# Patient Record
Sex: Female | Born: 1965 | Race: White | Hispanic: No | Marital: Married | Smoking: Never smoker
Health system: Southern US, Community
[De-identification: ages and names within clinical notes are randomized; demographics above are authoritative.]

## PROBLEM LIST (undated history)

## (undated) DIAGNOSIS — I10 Essential (primary) hypertension: Secondary | ICD-10-CM

## (undated) DIAGNOSIS — I2699 Other pulmonary embolism without acute cor pulmonale: Secondary | ICD-10-CM

## (undated) HISTORY — PX: WISDOM TOOTH EXTRACTION: SHX21

## (undated) HISTORY — PX: BREAST CYST ASPIRATION: SHX578

---

## 2011-08-10 ENCOUNTER — Other Ambulatory Visit: Payer: Self-pay | Admitting: Obstetrics and Gynecology

## 2011-08-10 DIAGNOSIS — N6009 Solitary cyst of unspecified breast: Secondary | ICD-10-CM

## 2011-08-18 ENCOUNTER — Ambulatory Visit
Admission: RE | Admit: 2011-08-18 | Discharge: 2011-08-18 | Disposition: A | Discharge status: Home / Self Care | Payer: 59 | Source: Ambulatory Visit | Attending: Obstetrics and Gynecology | Admitting: Obstetrics and Gynecology

## 2011-08-18 DIAGNOSIS — N6009 Solitary cyst of unspecified breast: Secondary | ICD-10-CM

## 2012-07-10 ENCOUNTER — Other Ambulatory Visit: Payer: Self-pay | Admitting: Obstetrics and Gynecology

## 2012-07-10 DIAGNOSIS — Z1231 Encounter for screening mammogram for malignant neoplasm of breast: Secondary | ICD-10-CM

## 2012-08-20 ENCOUNTER — Ambulatory Visit
Admission: RE | Admit: 2012-08-20 | Discharge: 2012-08-20 | Disposition: A | Discharge status: Home / Self Care | Payer: 59 | Source: Ambulatory Visit | Attending: Obstetrics and Gynecology | Admitting: Obstetrics and Gynecology

## 2012-08-20 DIAGNOSIS — Z1231 Encounter for screening mammogram for malignant neoplasm of breast: Secondary | ICD-10-CM

## 2013-08-04 DIAGNOSIS — I2699 Other pulmonary embolism without acute cor pulmonale: Secondary | ICD-10-CM

## 2013-08-04 HISTORY — DX: Other pulmonary embolism without acute cor pulmonale: I26.99

## 2013-08-19 ENCOUNTER — Inpatient Hospital Stay (HOSPITAL_BASED_OUTPATIENT_CLINIC_OR_DEPARTMENT_OTHER)
Admission: EM | Admit: 2013-08-19 | Discharge: 2013-08-22 | DRG: 299 | Disposition: A | Discharge status: Home / Self Care | Payer: 59 | Attending: Internal Medicine | Admitting: Internal Medicine

## 2013-08-19 ENCOUNTER — Encounter (HOSPITAL_BASED_OUTPATIENT_CLINIC_OR_DEPARTMENT_OTHER): Payer: Self-pay | Admitting: Emergency Medicine

## 2013-08-19 ENCOUNTER — Other Ambulatory Visit: Payer: Self-pay

## 2013-08-19 ENCOUNTER — Emergency Department (HOSPITAL_BASED_OUTPATIENT_CLINIC_OR_DEPARTMENT_OTHER): Payer: 59

## 2013-08-19 ENCOUNTER — Other Ambulatory Visit (HOSPITAL_BASED_OUTPATIENT_CLINIC_OR_DEPARTMENT_OTHER): Payer: Self-pay | Admitting: Family Medicine

## 2013-08-19 ENCOUNTER — Ambulatory Visit (HOSPITAL_BASED_OUTPATIENT_CLINIC_OR_DEPARTMENT_OTHER)
Admission: RE | Admit: 2013-08-19 | Discharge: 2013-08-19 | Disposition: A | Discharge status: Home / Self Care | Payer: 59 | Source: Ambulatory Visit | Attending: Family Medicine | Admitting: Family Medicine

## 2013-08-19 DIAGNOSIS — M7989 Other specified soft tissue disorders: Secondary | ICD-10-CM

## 2013-08-19 DIAGNOSIS — I82409 Acute embolism and thrombosis of unspecified deep veins of unspecified lower extremity: Secondary | ICD-10-CM

## 2013-08-19 DIAGNOSIS — I2699 Other pulmonary embolism without acute cor pulmonale: Secondary | ICD-10-CM | POA: Diagnosis present

## 2013-08-19 DIAGNOSIS — I2692 Saddle embolus of pulmonary artery without acute cor pulmonale: Secondary | ICD-10-CM | POA: Diagnosis present

## 2013-08-19 DIAGNOSIS — I10 Essential (primary) hypertension: Secondary | ICD-10-CM | POA: Diagnosis present

## 2013-08-19 DIAGNOSIS — M79609 Pain in unspecified limb: Secondary | ICD-10-CM

## 2013-08-19 DIAGNOSIS — I803 Phlebitis and thrombophlebitis of lower extremities, unspecified: Secondary | ICD-10-CM | POA: Diagnosis present

## 2013-08-19 DIAGNOSIS — Z79899 Other long term (current) drug therapy: Secondary | ICD-10-CM

## 2013-08-19 HISTORY — DX: Other pulmonary embolism without acute cor pulmonale: I26.99

## 2013-08-19 HISTORY — DX: Essential (primary) hypertension: I10

## 2013-08-19 LAB — COMPREHENSIVE METABOLIC PANEL
ALK PHOS: 58 U/L (ref 39–117)
ALT: 13 U/L (ref 0–35)
AST: 14 U/L (ref 0–37)
Albumin: 3 g/dL — ABNORMAL LOW (ref 3.5–5.2)
BUN: 7 mg/dL (ref 6–23)
CALCIUM: 8.6 mg/dL (ref 8.4–10.5)
CO2: 21 mEq/L (ref 19–32)
CREATININE: 0.72 mg/dL (ref 0.50–1.10)
Chloride: 103 mEq/L (ref 96–112)
GFR calc non Af Amer: >90 mL/min (ref >90)
GLUCOSE: 89 mg/dL (ref 70–99)
Potassium: 3.7 mEq/L (ref 3.7–5.3)
Sodium: 140 mEq/L (ref 137–147)
TOTAL PROTEIN: 7.1 g/dL (ref 6.0–8.3)
Total Bilirubin: 0.3 mg/dL (ref 0.3–1.2)

## 2013-08-19 LAB — CBC WITH DIFFERENTIAL/PLATELET
BASOS ABS: 0 10*3/uL (ref 0.0–0.1)
Basophils Absolute: 0 10*3/uL (ref 0.0–0.1)
Basophils Relative: 0 % (ref 0–1)
Basophils Relative: 0 % (ref 0–1)
Eosinophils Absolute: 0.1 10*3/uL (ref 0.0–0.7)
Eosinophils Absolute: 0.1 10*3/uL (ref 0.0–0.7)
Eosinophils Relative: 1 % (ref 0–5)
Eosinophils Relative: 1 % (ref 0–5)
HCT: 39.1 % (ref 36.0–46.0)
HEMATOCRIT: 40.6 % (ref 36.0–46.0)
HEMOGLOBIN: 13.8 g/dL (ref 12.0–15.0)
Hemoglobin: 13.7 g/dL (ref 12.0–15.0)
LYMPHS PCT: 23 % (ref 12–46)
Lymphocytes Relative: 17 % (ref 12–46)
Lymphs Abs: 1.5 10*3/uL (ref 0.7–4.0)
Lymphs Abs: 1.9 10*3/uL (ref 0.7–4.0)
MCH: 32.4 pg (ref 26.0–34.0)
MCH: 33 pg (ref 26.0–34.0)
MCHC: 33.7 g/dL (ref 30.0–36.0)
MCHC: 35.3 g/dL (ref 30.0–36.0)
MCV: 96 fL (ref 78.0–100.0)
MCV: 93.5 fL (ref 78.0–100.0)
MONOS PCT: 5 % (ref 3–12)
Monocytes Absolute: 0.5 10*3/uL (ref 0.1–1.0)
Monocytes Absolute: 0.4 10*3/uL (ref 0.1–1.0)
Monocytes Relative: 6 % (ref 3–12)
NEUTROS ABS: 6.8 10*3/uL (ref 1.7–7.7)
NEUTROS ABS: 5.9 10*3/uL (ref 1.7–7.7)
NEUTROS PCT: 71 % (ref 43–77)
Neutrophils Relative %: 76 % (ref 43–77)
PLATELETS: 188 10*3/uL (ref 150–400)
Platelets: 206 10*3/uL (ref 150–400)
RBC: 4.23 MIL/uL (ref 3.87–5.11)
RBC: 4.18 MIL/uL (ref 3.87–5.11)
RDW: 12.1 % (ref 11.5–15.5)
RDW: 12.6 % (ref 11.5–15.5)
WBC: 8.9 10*3/uL (ref 4.0–10.5)
WBC: 8.2 10*3/uL (ref 4.0–10.5)

## 2013-08-19 LAB — BASIC METABOLIC PANEL
BUN: 9 mg/dL (ref 6–23)
CALCIUM: 8.6 mg/dL (ref 8.4–10.5)
CHLORIDE: 102 meq/L (ref 96–112)
CO2: 20 mEq/L (ref 19–32)
CREATININE: 0.8 mg/dL (ref 0.50–1.10)
GFR calc Af Amer: >90 mL/min (ref >90)
GFR, EST NON AFRICAN AMERICAN: 88 mL/min — AB (ref >90)
Glucose, Bld: 91 mg/dL (ref 70–99)
Potassium: 3.6 mEq/L — ABNORMAL LOW (ref 3.7–5.3)
Sodium: 137 mEq/L (ref 137–147)

## 2013-08-19 LAB — MAGNESIUM: Magnesium: 2.1 mg/dL (ref 1.5–2.5)

## 2013-08-19 LAB — TROPONIN I: Troponin I: <0.3 ng/mL (ref <0.30)

## 2013-08-19 MED ORDER — ACETAMINOPHEN 650 MG RE SUPP: 650.0000 mg | Freq: Four times a day (QID) | RECTAL | Status: DC | PRN

## 2013-08-19 MED ORDER — SODIUM CHLORIDE 0.9 % IV SOLN
INTRAVENOUS | Status: DC
  Administered 2013-08-19 – 2013-08-20 (×2): via INTRAVENOUS

## 2013-08-19 MED ORDER — HEPARIN (PORCINE) IN NACL 100-0.45 UNIT/ML-% IJ SOLN
1550.0000 [IU]/h | INTRAMUSCULAR | Status: AC
  Administered 2013-08-19: 1150 [IU]/h via INTRAVENOUS
  Administered 2013-08-20: 1300 [IU]/h via INTRAVENOUS
  Filled 2013-08-19 (×4): qty 250

## 2013-08-19 MED ORDER — HEPARIN BOLUS VIA INFUSION
5000.0000 [IU] | Freq: Once | INTRAVENOUS | Status: AC
  Administered 2013-08-19: 5000 [IU] via INTRAVENOUS

## 2013-08-19 MED ORDER — ACETAMINOPHEN 325 MG PO TABS
650.0000 mg | ORAL_TABLET | Freq: Four times a day (QID) | ORAL | Status: DC | PRN
  Administered 2013-08-19: 650 mg via ORAL
  Filled 2013-08-19: qty 2

## 2013-08-19 MED ORDER — DOCUSATE SODIUM 100 MG PO CAPS
100.0000 mg | ORAL_CAPSULE | Freq: Two times a day (BID) | ORAL | Status: DC
  Administered 2013-08-19: 100 mg via ORAL
  Filled 2013-08-19 (×7): qty 1

## 2013-08-19 MED ORDER — IOHEXOL 350 MG/ML SOLN
100.0000 mL | Freq: Once | INTRAVENOUS | Status: AC | PRN
  Administered 2013-08-19: 100 mL via INTRAVENOUS

## 2013-08-19 MED ORDER — OXYCODONE HCL 5 MG PO TABS: 5.0000 mg | ORAL_TABLET | ORAL | Status: DC | PRN

## 2013-08-19 NOTE — Progress Notes (Signed)
Patient arrived with Heparin infusing at 11.5 mL/hr.  VSS BP 159/83  Pulse 92  Temp(Src) 97.9 F (36.6 C) (Oral)  Resp 16  Ht 5\' 2"  (1.575 m)  Wt 82.8 kg (182 lb 8.7 oz)  BMI 33.38 kg/m2  SpO2 100%  LMP 07/25/2013.  Awaiting orders. Will Continue to monitor.

## 2013-08-19 NOTE — ED Notes (Signed)
States she has a blood clot in her right leg. Pain and SOB for almost 2 weeks. [facility] to be seen.

## 2013-08-19 NOTE — ED Provider Notes (Addendum)
I saw and evaluated the patient, reviewed the resident's note and I agree with the findings and plan.   EKG Interpretation None     Patient with right lower extremity swelling and was diagnosed with having DVT today. She's also been short of breath. CT scan of chest shows multiple pulmonary emboli bilaterally. Patient to be started on heparin and [facility]      Rate: 83   Rhythm: normal sinus rhythm  QRS Axis: normal  Intervals: normal  ST/T Wave abnormalities: normal  Conduction Disutrbances:none  Narrative Interpretation:   Old EKG Reviewed: none available  Toy BakerAnthony T Deliah Strehlow, MD 08/19/13 1456  Toy BakerAnthony T Pryce Folts, MD 08/19/13 [phone]

## 2013-08-19 NOTE — ED Notes (Signed)
[facility] notified of bed [phone]

## 2013-08-19 NOTE — Progress Notes (Addendum)
ANTICOAGULATION CONSULT NOTE - Initial Consult  Pharmacy Consult for heparin gtt Indication: pulmonary embolus  No Known Allergies  Patient Measurements: Height: 5\' 2"  (157.5 cm) Weight: 185 lb (83.915 kg) IBW/kg (Calculated) : 50.1 Heparin Dosing Weight: 69 kg  Vital Signs: Temp: 97.9 F (36.6 C) (03/16 1307) Temp src: Oral (03/16 1307) BP: 133/83 mmHg (03/16 1307) Pulse Rate: 92 (03/16 1307)  Labs: No results found for this basename: HGB, HCT, PLT, APTT, LABPROT, INR, HEPARINUNFRC, CREATININE, CKTOTAL, CKMB, TROPONINI,  in the last 72 hours  CrCl is unknown because no creatinine reading has been taken.   Medical History: History reviewed. No pertinent past medical history.  Assessment: 48 yo F with hx of superficial DVT 2 years ago [facility] after PCP office visit where she complained of R leg pain and SOB since prior week.  CTA of chest with contrast reveals a large bilateral saddle emboli with high clot burden.  Pharmacy is consulted to start heparin gtt.  Per d/w Dr. Claiborne BillingsKuneff, patient is not on any "blood thinners" and will be [facility] shortly.  Goal of Therapy:  Heparin level 0.3-0.7 units/ml Monitor platelets by anticoagulation protocol: Yes   Plan:  - give heparin IV bolus of 5000 units, followed by 1150 units/hour - draw 6h HL - daily HL and CBC - monitor for s/s of bleeding  - f/u initial BMP and CBC when reported  Harrold DonathNathan E. Achilles Dunkope, PharmD Clinical Pharmacist - Resident Pager: [phone] Pharmacy: [phone] 08/19/2013 3:08 PM    Addendum ======================= Patient H/H and plt are wnl.  SCr resulted at 0.8 with estimated CrCl ~89.  Plan: - continue with current plan  Shelba Flakeathan E. Achilles Dunkope, PharmD Clinical Pharmacist - Resident Pager: [phone] Pharmacy: [phone] 08/19/2013 3:57 PM

## 2013-08-19 NOTE — ED Notes (Addendum)
Pt presents with redness to R calf and SOB. Pt reports wearing heels one week ago and felt like her muscles were stretched and sore. Pt sts " I had a blood clot in the same leg about two years ago. I just took aspirin then and that helped." Pt reports elevating leg and taking ibuprofen with pain with some relief.

## 2013-08-19 NOTE — H&P (Signed)
[facility] History and Physical  Joanne Powell RUE:[id number] DOB: 1965/10/22 DOA: 08/19/2013  Referring physician:  PCP: Ethel Rana  Specialists:   Chief Complaint: Right leg pain and shortness of breath x1 week  HPI: Joanne Powell is a 48 y.o. female PMHx superficial thrombophlebitis 2013 presented to the [facility] ED in [facility] after presenting to her PCP with right leg pain and shortness of breath for a week. Patient reports she were high he'll puts last week and then noticed right ankle pain that she thought was tendon pain. She then noticed that the pain started hurting from her ankle up to the back of her knee. She has been treating the pain with ibuprofen. States she thought she was feeling better when Saturday she squatted and felt that she pulled a muscle. She has been using a total properly throughout the weekend and has been increasingly more pain to today. She complains of mild shortness of breath throughout this entire event she describes as winded with palpitations. She reports she had a superficial DVT in the same leg 2 years ago that was treated with baby aspirin. She denies any travel, long car/pus/plain rides. She is a nonsmoker. She is on birth control pills. She is also on birth control pills the first time this happened. She states she does sit at work the majority today. She takes no other medications. Family history significant for breast cancer in mother and colon cancer in maternal grandmother.Currently patient sitting comfortably in bed, positive DOE, negative chest pain.    Review of Systems: The patient denies anorexia, fever, weight loss,, vision loss, decreased hearing, hoarseness, syncope, peripheral edema, balance deficits, hemoptysis, abdominal pain, melena, hematochezia, severe indigestion/heartburn, hematuria, incontinence, genital sores, muscle weakness, suspicious skin lesions, transient blindness, difficulty walking, depression, unusual weight  change, abnormal bleeding, enlarged lymph nodes, angioedema, and breast masses.    TRAVEL HISTORY:   Procedure CT angiogram chest PE protocol 08/19/2013 1. Very large bilateral saddle pulmonary emboli. Clot burden is very high.  Venous ultrasound of right lower extremity 08/19/2013  Thrombus within deep veins: Thrombus is noted within the superficial  femoral vein, popliteal vein, gastrocnemius veins and infrapopliteal veins.      Antibiotics   Consultation Dr. Arbie Cookey (vascular surgery)    Past Medical History  Diagnosis Date  . HTN (hypertension) 08/19/2013   History reviewed. No pertinent past surgical history. Social History:  reports that she has never smoked. She does not have any smokeless tobacco history on file. She reports that she does not drink alcohol or use illicit drugs. where does patient live--home, ALF, SNF? and with whom if at home? Can patient participate in ADLs? yes  No Known Allergies  No family history on file. Family history significant for breast cancer in mother and colon cancer in maternal grandmother.  Prior to Admission medications   Medication Sig Start Date End Date Taking? Authorizing Provider  Desogestrel-Ethinyl Estradiol (RECLIPSEN PO) Take 1 tablet by mouth daily.   Yes Historical Provider, MD  ibuprofen (ADVIL,MOTRIN) 200 MG tablet Take 200-400 mg by mouth every 6 (six) hours as needed for fever or mild pain.   Yes Historical Provider, MD  Multiple Vitamin (MULTIVITAMIN) tablet Take 1 tablet by mouth daily.   Yes Historical Provider, MD  naproxen sodium (ANAPROX) 220 MG tablet Take 220 mg by mouth daily as needed (pain).   Yes Historical Provider, MD   Physical Exam: Filed Vitals:   08/19/13 1307 08/19/13 1545 08/19/13 1755  BP: 133/83 141/88  159/83  Pulse: 92 92 92  Temp: 97.9 F (36.6 C)  97.9 F (36.6 C)  TempSrc: Oral  Oral  Resp: 20 15 16   Height: 5\' 2"  (1.575 m)  5\' 2"  (1.575 m)  Weight: 83.915 kg (185 lb)  82.8 kg (182 lb  8.7 oz)  SpO2: 100% 100% 100%     General:  A./O. X4, NAD  Eyes: pupils equal reactive to light and  Neck: negative lymphadenopathy negative JVD  Cardiovascular: regular rhythm and rate, negative murmurs rubs or gallops, DP/PT pulse on the right +1 on the left +2  Respiratory: clear to auscultation bilateral  Abdomen: soft, nontender, nondistended, plus bowel sound  Skin: negative bruises/lesions  Musculoskeletal: positive pain to palpation on the right leg; lateral upper thigh, posterior knee, gastrostomy is muscle, ankle.  Neurologic: cranial nerve II through XII intact, able to move all extremities,  Labs on Admission:  Basic Metabolic Panel:  Recent Labs Lab 08/19/13 1516  NA 137  K 3.6*  CL 102  CO2 20  GLUCOSE 91  BUN 9  CREATININE 0.80  CALCIUM 8.6   Liver Function Tests: No results found for this basename: AST, ALT, ALKPHOS, BILITOT, PROT, ALBUMIN,  in the last 168 hours No results found for this basename: LIPASE, AMYLASE,  in the last 168 hours No results found for this basename: AMMONIA,  in the last 168 hours CBC:  Recent Labs Lab 08/19/13 1516  WBC 8.9  NEUTROABS 6.8  HGB 13.7  HCT 40.6  MCV 96.0  PLT 188   Cardiac Enzymes: No results found for this basename: CKTOTAL, CKMB, CKMBINDEX, TROPONINI,  in the last 168 hours  BNP (last 3 results) No results found for this basename: PROBNP,  in the last 8760 hours CBG: No results found for this basename: GLUCAP,  in the last 168 hours  Radiological Exams on Admission: Ct Angio Chest W/cm &/or Wo Cm  08/19/2013   CLINICAL DATA:  Shortness of breath. Right lower extremity deep vein thrombosis.  EXAM: CT ANGIOGRAPHY CHEST WITH CONTRAST  TECHNIQUE: Multidetector CT imaging of the chest was performed using the standard protocol during bolus administration of intravenous contrast. Multiplanar CT image reconstructions and MIPs were obtained to evaluate the vascular anatomy.  CONTRAST:  100mL OMNIPAQUE  IOHEXOL 350 MG/ML SOLN  COMPARISON:  US VENOUS IMG LOWER UNI *R* dated 08/19/2013  FINDINGS: Large bilateral saddle emboli, especially on the left. These appear to span in the main pulmonary artery division and also the bifurcations of the right and left pulmonary arteries. Clot burden is very high.  Right ventricular to left ventricular ratio is 0.85, currently within normal limits, without reversal of the normal interventricular septal contour.  There is evidence of old granulomatous disease with a calcified lower paratracheal lymph node. Biapical pleural parenchymal scarring with faint nodularity. No pulmonary infarct identified. No significant parenchymal hemorrhage. Calcified granuloma in the superior segment left lower lobe.  Review of the MIP images confirms the above findings.  IMPRESSION: 1. Very large bilateral saddle pulmonary emboli. Clot burden is very high. Critical Value/emergent results were called by telephone at the time of interpretation on 08/19/2013 at 2:50 PM to Dr. Bruce Donathony Allen, who verbally acknowledged these results.   Electronically Signed   By: Herbie BaltimoreWalt  Liebkemann M.D.   On: 08/19/2013 14:51   [place] Venous Img Lower Unilateral Right  08/19/2013   CLINICAL DATA:  Right leg pain and swelling  EXAM: Right LOWER EXTREMITY VENOUS DOPPLER ULTRASOUND  TECHNIQUE: Gray-scale sonography with  graded compression, as well as color Doppler and duplex ultrasound, were performed to evaluate the deep venous system from the level of the common femoral vein through the popliteal and proximal calf veins. Spectral Doppler was utilized to evaluate flow at rest and with distal augmentation maneuvers.  COMPARISON:  None.  FINDINGS: Thrombus within deep veins: Thrombus is noted within the superficial femoral vein, popliteal vein, gastrocnemius veins and infrapopliteal veins.  Compressibility of deep veins: Decreased compressibility is noted in the above-noted veins. Decreased flow is noted as well.  Venous reflux:  None  visualized.  Other findings:  None visualized.  IMPRESSION: Diffuse deep venous thrombosis in the right leg from the mid thigh inferiorly.   Electronically Signed   By: Alcide Clever M.D.   On: 08/19/2013 11:46    EKG: NSR, nonspecific ST-T wave changes in leads 3 and aVF  Assessment/Plan Principal Problem:   Saddle embolism of pulmonary artery Active Problems:   Pulmonary emboli   Thrombophlebitis leg   HTN (hypertension)   DVT (deep venous thrombosis) multiple vessels right lower extremity   Saddle embolism a pulmonary artery(unprovoked) -Counseled patient that most likely when she initially felt the right lower extremity pain a week and half ago she had developed a DVT and it propagated. -consultation that thrombolytics for her saddle embolism currently not indicated (stable cardiac/pulmonary/hemodynamically)  -Patient with no risk factors except for oral birth control -Continue his heparin drip pharmacy to dose -continuous pulse ox -Continue on telemetry -Nonspecific EKG changes obtain echocardiogram -malignancy?  Right lower extremity multiple vessel DVT -consulted Dr. Arbie Cookey (vascular surgery) and he will make recommendations as to possible thrombolytics for multiple right lower extremity clots. -since patient arrived on heparin only portion of hypercoagulable panel which can be run is the antiphospholipid syndrome panel.  HTN -will allow to intermittently run high tonight      Code Status: full Family Communication: none  Disposition Plan: resolution of DVT/cells in the  Time spent:90 minutes   Drema Dallas [facility] Pager [phone]  If 7PM-7AM, please contact night-coverage [email or web address] Password [facility] 08/19/2013, 8:24 PM

## 2013-08-19 NOTE — ED Provider Notes (Signed)
CSN: [id number]     Arrival date & time 08/19/13  1220 History   First MD Initiated Contact with Patient 08/19/13 1309     Chief Complaint  Patient presents with  . Claudication   HPI Joanne Powell is 48 y.o. female presented to the ED after presenting to her PCP with right leg pain and shortness of breath for a week. Patient reports she were high he'll puts last week and then noticed right ankle pain that she thought was tendon pain. She then noticed that the pain started hurting from her ankle up to the back of her knee. She has been treating the pain with ibuprofen. States she thought she was feeling better when Saturday she squatted and felt that she pulled a muscle. She has been using a total properly throughout the weekend and has been increasingly more pain to today. She complains of mild shortness of breath throughout this entire event she describes as winded with palpitations. She reports she had a superficial DVT in the same leg 2 years ago that was treated with baby aspirin. She denies any travel, long car/pus/plain rides. She is a nonsmoker. She is on birth control pills. She is also on birth control pills the first time this happened. She states she does sit at work the majority today. He takes no other medications. Family history significant for breast cancer in mother and colon cancer in maternal grandmother.  History reviewed. No pertinent past medical history. History reviewed. No pertinent past surgical history. No family history on file. History  Substance Use Topics  . Smoking status: Never Smoker   . Smokeless tobacco: Not on file  . Alcohol Use: No   OB History   Grav Para Term Preterm Abortions TAB SAB Ect Mult Living                 Review of Systems  Allergies  Review of patient's allergies indicates no known allergies.  Home Medications  No current outpatient prescriptions on file. BP 133/83  Pulse 92  Temp(Src) 97.9 F (36.6 C) (Oral)  Resp 20  Ht 5\' 2"   (1.575 m)  Wt 185 lb (83.915 kg)  BMI 33.83 kg/m2  SpO2 100%  LMP 07/25/2013 Physical Exam Gen: NAD. Appears well, nontoxic. HEENT: AT. [place]. Bilateral TM visualized and normal in appearance. Bilateral eyes without injections or icterus. MMM. Bilateral nares WNL. Throat without erythema or exudates.  CV: RRR, 2/6 systolic murmur appreciated. Chest: CTAB, no wheeze or crackles Abd: Soft. NTND. BS present. No Masses palpated.  Ext: Swelling of right ankle medially. Taut shiny skin. Eythema present. Left homan's negative. Palpable cords right calf. Tender to palpation right calf, negative left Skin: No rashes, purpura or petechiae.  Neuro: Limp. PERLA. EOMi. Alert. Grossly intact.  Psych: Normal dress, affect and demeanor.  ED Course  Procedures (including critical care time) Labs Review Labs Reviewed  CBC WITH DIFFERENTIAL  BASIC METABOLIC PANEL  HEPARIN LEVEL (UNFRACTIONATED)   Imaging Review Ct Angio Chest W/cm &/or Wo Cm  08/19/2013   CLINICAL DATA:  Shortness of breath. Right lower extremity deep vein thrombosis.  EXAM: CT ANGIOGRAPHY CHEST WITH CONTRAST  TECHNIQUE: Multidetector CT imaging of the chest was performed using the standard protocol during bolus administration of intravenous contrast. Multiplanar CT image reconstructions and MIPs were obtained to evaluate the vascular anatomy.  CONTRAST:  OMNIPAQUE IOHEXOL 350 MG/ML SOLN  COMPARISON:  US VENOUS IMG LOWER UNI *R* dated 08/19/2013  FINDINGS: Large bilateral saddle emboli,  especially on the left. These appear to span in the main pulmonary artery division and also the bifurcations of the right and left pulmonary arteries. Clot burden is very high.  Right ventricular to left ventricular ratio is 0.85, currently within normal limits, without reversal of the normal interventricular septal contour.  There is evidence of old granulomatous disease with a calcified lower paratracheal lymph node. Biapical pleural parenchymal scarring  with faint nodularity. No pulmonary infarct identified. No significant parenchymal hemorrhage. Calcified granuloma in the superior segment left lower lobe.  Review of the MIP images confirms the above findings.  IMPRESSION: 1. Very large bilateral saddle pulmonary emboli. Clot burden is very high. Critical Value/emergent results were called by telephone at the time of interpretation on 08/19/2013 at 2:50 PM to Dr. Bruce Donathony Allen, who verbally acknowledged these results.   Electronically Signed   By: Herbie BaltimoreWalt  Liebkemann M.D.   On: 08/19/2013 14:51   [place] Venous Img Lower Unilateral Right  08/19/2013   CLINICAL DATA:  Right leg pain and swelling  EXAM: Right LOWER EXTREMITY VENOUS DOPPLER ULTRASOUND  TECHNIQUE: Gray-scale sonography with graded compression, as well as color Doppler and duplex ultrasound, were performed to evaluate the deep venous system from the level of the common femoral vein through the popliteal and proximal calf veins. Spectral Doppler was utilized to evaluate flow at rest and with distal augmentation maneuvers.  COMPARISON:  None.  FINDINGS: Thrombus within deep veins: Thrombus is noted within the superficial femoral vein, popliteal vein, gastrocnemius veins and infrapopliteal veins.  Compressibility of deep veins: Decreased compressibility is noted in the above-noted veins. Decreased flow is noted as well.  Venous reflux:  None visualized.  Other findings:  None visualized.  IMPRESSION: Diffuse deep venous thrombosis in the right leg from the mid thigh inferiorly.   Electronically Signed   By: Alcide CleverMark  Lukens M.D.   On: 08/19/2013 11:46     EKG Interpretation None      MDM   Final diagnoses:  Pulmonary emboli  DVT of leg (deep venous thrombosis)   Patient in no acute distress, or respiratory distress. Saturations 100% on RA. Patient afebrile and not tachycardic. EKG normal. Venous doppler studies with diffuse deep venous thrombosis of the right lower extremity. This is the patients second  thrombosis in that leg, first being superficial 2 years ago. CTA resulted large clot burden with bilateral very large saddle emboli. Enticing event occurred ten days prior, therefore no TPA was considered. CBC and BMP were obtained, pending. Patient was comfortable and in no discomfort or requiring oxygen supplementation. Vitals are stable. Heparin gtt started.  [facility] accepting physician Dr. Joseph ArtWoods.   Natalia LeatherwoodRenee A Dustie Brittle, DO 08/19/13 1545

## 2013-08-20 ENCOUNTER — Encounter (HOSPITAL_COMMUNITY): Payer: Self-pay | Admitting: General Practice

## 2013-08-20 DIAGNOSIS — I2699 Other pulmonary embolism without acute cor pulmonale: Secondary | ICD-10-CM

## 2013-08-20 DIAGNOSIS — I82409 Acute embolism and thrombosis of unspecified deep veins of unspecified lower extremity: Principal | ICD-10-CM

## 2013-08-20 LAB — CBC
HEMATOCRIT: 42.4 % (ref 36.0–46.0)
HEMOGLOBIN: 14.8 g/dL (ref 12.0–15.0)
MCH: 33.1 pg (ref 26.0–34.0)
MCHC: 34.9 g/dL (ref 30.0–36.0)
MCV: 94.9 fL (ref 78.0–100.0)
Platelets: 231 10*3/uL (ref 150–400)
RBC: 4.47 MIL/uL (ref 3.87–5.11)
RDW: 12.7 % (ref 11.5–15.5)
WBC: 7.5 10*3/uL (ref 4.0–10.5)

## 2013-08-20 LAB — TROPONIN I: Troponin I: <0.3 ng/mL (ref <0.30)

## 2013-08-20 LAB — ANTITHROMBIN III: ANTITHROMB III FUNC: 69 % — AB (ref 75–120)

## 2013-08-20 LAB — HEPARIN LEVEL (UNFRACTIONATED)
HEPARIN UNFRACTIONATED: 0.38 [IU]/mL (ref 0.30–0.70)
HEPARIN UNFRACTIONATED: 0.29 [IU]/mL — AB (ref 0.30–0.70)
Heparin Unfractionated: 0.35 IU/mL (ref 0.30–0.70)

## 2013-08-20 LAB — HOMOCYSTEINE: Homocysteine: 6.6 umol/L (ref 4.0–15.4)

## 2013-08-20 LAB — TSH: TSH: 2.912 u[IU]/mL (ref 0.350–4.500)

## 2013-08-20 NOTE — ED Provider Notes (Signed)
I saw and evaluated the patient, reviewed the resident's note and I agree with the findings and plan.   EKG Interpretation None       Toy BakerAnthony T Ranee Peasley, MD 08/20/13 [phone]

## 2013-08-20 NOTE — Consult Note (Signed)
Name: Joanne Powell MRN: [id number] DOB: 04-11-1966    ADMISSION DATE:  08/19/2013 CONSULTATION DATE:  08/20/2013  REFERRING MD :  Dr Jomarie Longs PRIMARY SERVICE:  TRH  CHIEF COMPLAINT:  PE  BRIEF PATIENT DESCRIPTION: 48 year old female with PMH of HTN admitted 3/16 for bilateral saddle pulmonary emboli. PCCM asked to assist with her care.   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 She felt the onset of symptoms was related to wearing high-heels one day last week. Thought she pulled a muscle, which improved by 3/13, but then 3/14 it got worse again. 3/15 the pain moved up her leg into her thigh which prompted her to seek medical attention. She also reports feeling dyspnea with minimal exertion during that time period, but was not overwhelmingly concerned with it. In the ED CTA chest discovered large bilateral saddle emboli. BLE doppler found extensive R sided DVT. She uses oral contraceptive.   PAST MEDICAL HISTORY :  Past Medical History  Diagnosis Date  . HTN (hypertension) 08/19/2013  . PE (pulmonary embolism) 08/2013   Past Surgical History  Procedure Laterality Date  . Wisdom tooth extraction     Prior to Admission medications   Medication Sig Start Date End Date Taking? Authorizing Provider  Desogestrel-Ethinyl Estradiol (RECLIPSEN PO) Take 1 tablet by mouth daily.   Yes Historical Provider, MD  ibuprofen (ADVIL,MOTRIN) 200 MG tablet Take 200-400 mg by mouth every 6 (six) hours as needed for fever or mild pain.   Yes Historical Provider, MD  Multiple Vitamin  (MULTIVITAMIN) tablet Take 1 tablet by mouth daily.   Yes Historical Provider, MD  naproxen sodium (ANAPROX) 220 MG tablet Take 220 mg by mouth daily as needed (pain).   Yes Historical Provider, MD   No Known Allergies  FAMILY HISTORY:  History reviewed. No pertinent family history. SOCIAL HISTORY:  reports that she has never smoked. She has never used smokeless tobacco. She reports that she does not drink alcohol or use illicit drugs.  REVIEW OF SYSTEMS:   Bolds are positive  Constitutional: weight loss, gain, night sweats, Fevers, chills, fatigue .  HEENT: headaches, Sore throat, sneezing, nasal congestion, post nasal drip, Difficulty swallowing, Tooth/dental problems, visual complaints visual changes, ear ache CV:  chest pain, radiates: ,Orthopnea, PND, swelling in lower extremities, dizziness, palpitations, syncope.  GI  heartburn, indigestion, abdominal pain, nausea, vomiting, diarrhea, change in bowel habits, loss of appetite, bloody stools.  Resp: cough, productive: , hemoptysis, dyspnea, chest pain, pleuritic.  Skin: rash or itching or icterus GU: dysuria, change in color of urine, urgency or frequency. flank pain, hematuria  MS: R leg pain or swelling. decreased range of motion  Psych: change in mood or affect. depression or anxiety.  Neuro: difficulty with speech, weakness, numbness, ataxia    SUBJECTIVE:   VITAL SIGNS: Temp:  [97.9 F (36.6 C)-98.6 F (37 C)] 98.6 F (37 C) (03/17 0517) Pulse Rate:  [78-92] 78 (03/17 0517) Resp:  [15-20] 17 (03/17 0517) BP: (124-159)/(79-88) 124/79 mmHg (03/17 0517) SpO2:  [94 %-100 %] 94 % (03/17 0517) Weight:  [82.8 kg (182  lb 8.7 oz)-83.915 kg (185 lb)] 82.8 kg (182 lb 8.7 oz) (03/16 1755)  PHYSICAL EXAMINATION: General:  Overweight female (182 lbs) in no acute distress Neuro:  Alert, oriented HEENT:  [place]/AT no JVD noted Cardiovascular:  RRR Lungs:  Clear posterior chest Abdomen:  Soft, non-tender, non  distended Musculoskeletal:  No acute deformity Skin:  Intact, trace edema RLE.    Recent Labs Lab 08/19/13 1516 08/19/13 2010  NA 137 140  K 3.6* 3.7  CL 102 103  CO2 20 21  BUN 9 7  CREATININE 0.80 0.72  GLUCOSE 91 89    Recent Labs Lab 08/19/13 1516 08/19/13 2010  HGB 13.7 13.8  HCT 40.6 39.1  WBC 8.9 8.2  PLT 188 206   Ct Angio Chest W/cm &/or Wo Cm  08/19/2013   CLINICAL DATA:  Shortness of breath. Right lower extremity deep vein thrombosis.  EXAM: CT ANGIOGRAPHY CHEST WITH CONTRAST  TECHNIQUE: Multidetector CT imaging of the chest was performed using the standard protocol during bolus administration of intravenous contrast. Multiplanar CT image reconstructions and MIPs were obtained to evaluate the vascular anatomy.  CONTRAST:  100mL OMNIPAQUE IOHEXOL 350 MG/ML SOLN  COMPARISON:  US VENOUS IMG LOWER UNI *R* dated 08/19/2013  FINDINGS: Large bilateral saddle emboli, especially on the left. These appear to span in the main pulmonary artery division and also the bifurcations of the right and left pulmonary arteries. Clot burden is very high.  Right ventricular to left ventricular ratio is 0.85, currently within normal limits, without reversal of the normal interventricular septal contour.  There is evidence of old granulomatous disease with a calcified lower paratracheal lymph node. Biapical pleural parenchymal scarring with faint nodularity. No pulmonary infarct identified. No significant parenchymal hemorrhage. Calcified granuloma in the superior segment left lower lobe.  Review of the MIP images confirms the above findings.  IMPRESSION: 1. Very large bilateral saddle pulmonary emboli. Clot burden is very high. Critical Value/emergent results were called by telephone at the time of interpretation on 08/19/2013 at 2:50 PM to Dr. Bruce Donathony Allen, who verbally acknowledged these results.   Electronically Signed   By: Herbie BaltimoreWalt  Liebkemann M.D.   On: 08/19/2013 14:51   [place] Venous Img Lower  Unilateral Right  08/19/2013   CLINICAL DATA:  Right leg pain and swelling  EXAM: Right LOWER EXTREMITY VENOUS DOPPLER ULTRASOUND  TECHNIQUE: Gray-scale sonography with graded compression, as well as color Doppler and duplex ultrasound, were performed to evaluate the deep venous system from the level of the common femoral vein through the popliteal and proximal calf veins. Spectral Doppler was utilized to evaluate flow at rest and with distal augmentation maneuvers.  COMPARISON:  None.  FINDINGS: Thrombus within deep veins: Thrombus is noted within the superficial femoral vein, popliteal vein, gastrocnemius veins and infrapopliteal veins.  Compressibility of deep veins: Decreased compressibility is noted in the above-noted veins. Decreased flow is noted as well.  Venous reflux:  None visualized.  Other findings:  None visualized.  IMPRESSION: Diffuse deep venous thrombosis in the right leg from the mid thigh inferiorly.   Electronically Signed   By: Alcide CleverMark  Lukens M.D.   On: 08/19/2013 11:46    ASSESSMENT / PLAN:  Bilateral pulmonary emboli  - Agree with IV heparin  - No indication for IVC filter per Vascular Surgery  - TTE pending  - Consider initiating oral agent tomorrow  - Discussed some oral anticoagulant options with patient, she is considering.  - Will require need 6 months of oral  therapy.  - She will need to use alternative contraceptive technique    - Consider hypercoagulable workup  Joneen Roach, ACNP [facility] Pulmonology/Critical Care Pager [phone] or [phone]  08/20/2013, 8:13 AM  Reviewed above, examined pt.  48 yo female on oral contraceptives for birth control presented with approximately one week of right leg pain, swelling, chest discomfort, and dyspnea.  Found to have right leg DVT and b/l PE.  Hemodynamically stable, and no evidence for RV strain on CT chest >> Echo pending.  No other obvious risk factors for thrombo-embolic disease >> hypercoagulable panel  pending.  No indications for thrombolytic therapy at this time.  Agree that IVC filter placement likely not warranted at this time.  Discussed different options for oral anti-coagulation >> warfarin versus newer direct thrombin inhibitors (DTI's).  Reviewed plus/minus for each of these.  She will think about her options for now.  If further hypercoagulable w/u is negative, then she should continue anti-coagulation for 3 months and then re-assess whether she needs longer course of anticoagulation.  Additionally, she will need to find alternative form of birth control >> this was discussed with her.  Coralyn Helling, MD [facility] Pulmonary/Critical Care 08/20/2013, 10:31 AM Pager:  [phone] After 3pm call: [phone]

## 2013-08-20 NOTE — Progress Notes (Signed)
ANTICOAGULATION CONSULT NOTE - Follow Up Consult  Pharmacy Consult for heparin Indication: bilateral PE and right femoral DVT  No Known Allergies  Patient Measurements: Height: 5\' 2"  (157.5 cm) Weight: 182 lb 8.7 oz (82.8 kg) IBW/kg (Calculated) : 50.1 Heparin Dosing Weight: 69 kg  Vital Signs: Temp: 99.1 F (37.3 C) (03/17 1335) Temp src: Oral (03/17 1335) BP: 131/78 mmHg (03/17 1335) Pulse Rate: 91 (03/17 1335)  Labs:  Recent Labs  08/19/13 1516 08/19/13 2010 08/19/13 2318 08/20/13 0149 08/20/13 0810 08/20/13 0950  HGB 13.7 13.8  --   --  14.8  --   HCT 40.6 39.1  --   --  42.4  --   PLT 188 206  --   --  231  --   HEPARINUNFRC  --   --  0.35  --   --  0.38  CREATININE 0.80 0.72  --   --   --   --   TROPONINI  --  <0.30  --  <0.30  --   --     Estimated Creatinine Clearance: 88.6 ml/min (by C-G formula based on Cr of 0.72).   Medications:  Scheduled:  . docusate sodium  100 mg Oral BID   Infusions:  . sodium chloride 75 mL/hr at 08/20/13 0523  . heparin 1,400 Units/hr (08/20/13 1439)    Assessment: 48 yo female with bilateral PE and right femoral DVT is currently on subtherapeutic heparin.  Heparin level is 0.29.  No problem with infusion per RN. Goal of Therapy:  Heparin level 0.3-0.7 units/ml (Closer to 0.7) Monitor platelets by anticoagulation protocol: Yes   Plan:  1) Increase heparin to 1550 units/hr.  2) Check a 6hr heparin level after rate is changed  Laurita Peron, Tsz-Yin 08/20/2013,5:55 PM

## 2013-08-20 NOTE — Consult Note (Signed)
Patient name: Joanne Powell MRN: [id number] DOB: [birth year] Sex: female   Referred by: [facility] hospitalist  Reason for referral:  Leg DVT and pulmonary embolus  HISTORY OF PRESENT ILLNESS: This is an otherwise healthy 48 year old female. She does have a history several years ago of striking her right calf and developing superficial thrombophlebitis. She had a duplex at that time an outlying hospital and this showed no evidence of DVT but did show surface thrombus. She was appropriately treated with aspirin therapy. She has had no other history of any thrombotic events and has no history of cardiac or pulmonary difficulty. In further questioning she has a history of a great grandfather who may have had "clotting issues" but no closer relatives. She is a nonsmoker. She is on birth control pills but no other tension thrombogenic medications  Past Medical History  Diagnosis Date  . HTN (hypertension) 08/19/2013  . PE (pulmonary embolism) 08/2013    Past Surgical History  Procedure Laterality Date  . Wisdom tooth extraction      History   Social History  . Marital Status: Married    Spouse Name: N/A    Number of Children: N/A  . Years of Education: N/A   Occupational History  . Not on file.   Social History Main Topics  . Smoking status: Never Smoker   . Smokeless tobacco: Never Used  . Alcohol Use: No  . Drug Use: No  . Sexual Activity: Not on file   Other Topics Concern  . Not on file   Social History Narrative  . No narrative on file    History reviewed. No pertinent family history.  Allergies as of 08/19/2013  . (No Known Allergies)    No current facility-administered medications on file prior to encounter.   No current outpatient prescriptions on file prior to encounter.     REVIEW OF SYSTEMS:  Viewed in her history and physical was nothing else to have otherwise all systems negative PHYSICAL EXAMINATION:  General: The patient is a well-nourished  female, in no acute distress. Vital signs are BP 124/79  Pulse 78  Temp(Src) 98.6 F (37 C) (Oral)  Resp 17  Ht 5\' 2"  (1.575 m)  Wt 182 lb 8.7 oz (82.8 kg)  BMI 33.38 kg/m2  SpO2 94%  LMP 07/25/2013 Pulmonary: There is a good air exchange no respiratory distress. Abdomen: Soft and non-tender  Musculoskeletal: There are no major deformities.  There is no significant extremity pain. Neurologic: No focal weakness or paresthesias are detected, Skin: There are no ulcer or rashes noted. Changes of venous hypertension Psychiatric: The patient has normal affect. Cardiovascular: 2+ radial and 2+ dorsalis pedis pulses bilaterally No significant swelling in the right leg versus left leg. Some mild calf tenderness.   CT scan was reviewed showing bilateral pulmonary embolus  Lower extremity duplex shows right femoral vein and more distal DVT with no iliac vein DVT  Impression and Plan:  Leg DVT and pulmonary embolus. Discuss this at length with the patient and her husband present. Fortunately she has very mild symptoms related to her pulmonary embolus and has no significant right leg swelling. Explain that the treatment would be adequate it lay. She does not have any indication for a vena cava filter since she has not been on anticoagulation and is not failed with progression of her clot. Not see any current need for thrombolyzes related to her right leg since she is not having any significant swelling or iliac  DVT. Pulmonary care Krista BlueSinger regarding her pulmonary embolus treatment. Will not follow actively. Did discuss long-term sequela of DVT in that she will need elevation and potential compression garments in the future if she notices that prolonged swelling after resolution of her acute clot. Pleasecall if we can assist further.    EARLY, TODD [facility] [place] Office: [phone]

## 2013-08-20 NOTE — Progress Notes (Addendum)
[facility] PROGRESS NOTE  Joanne Powell ZOX:[id number] DOB: [birth year] DOA: 08/19/2013 PCP: Ethel Rana  Assessment/Plan: 1. Large Saddle PE - Continue IV heparin -Extensive DVT in RLE -IVC filter placement was not felt to be necessary per Vascular Dr.Early and Pulm -Pulm consult, d/w dr.Sood -check hypercoagulable panel -Stop OCPs -Hemodynamics stable at this point -check 2D ECHO for R heart strain/failure  2. DVt  -as above   Code Status: Full Code Family Communication: d/w pt and spouse at bedside Disposition Plan: home when stable   Consultants:  Pulm  IR  HPI/Subjective: Feels ok, DoE  Objective: Filed Vitals:   08/20/13 0517  BP: 124/79  Pulse: 78  Temp: 98.6 F (37 C)  Resp: 17   No intake or output data in the 24 hours ending 08/20/13 0755 Filed Weights   08/19/13 1307 08/19/13 1755  Weight: 83.915 kg (185 lb) 82.8 kg (182 lb 8.7 oz)    Exam:   General:  AAOx3, no distress  Cardiovascular: S1S2/RRR  Respiratory: CTAB  Abdomen: soft, Nt, ND, BS present  Musculoskeletal: 2 plus edema RLE  Data Reviewed: Basic Metabolic Panel:  Recent Labs Lab 08/19/13 1516 08/19/13 2010  NA 137 140  K 3.6* 3.7  CL 102 103  CO2 20 21  GLUCOSE 91 89  BUN 9 7  CREATININE 0.80 0.72  CALCIUM 8.6 8.6  MG  --  2.1   Liver Function Tests:  Recent Labs Lab 08/19/13 2010  AST 14  ALT 13  ALKPHOS 58  BILITOT 0.3  PROT 7.1  ALBUMIN 3.0*   No results found for this basename: LIPASE, AMYLASE,  in the last 168 hours No results found for this basename: AMMONIA,  in the last 168 hours CBC:  Recent Labs Lab 08/19/13 1516 08/19/13 2010  WBC 8.9 8.2  NEUTROABS 6.8 5.9  HGB 13.7 13.8  HCT 40.6 39.1  MCV 96.0 93.5  PLT 188 206   Cardiac Enzymes:  Recent Labs Lab 08/19/13 2010 08/20/13 0149  TROPONINI <0.30 <0.30   BNP (last 3 results) No results found for this basename: PROBNP,  in the last 8760 hours CBG: No results  found for this basename: GLUCAP,  in the last 168 hours  No results found for this or any previous visit (from the past 240 hour(s)).   Studies: Ct Angio Chest W/cm &/or Wo Cm  08/19/2013   CLINICAL DATA:  Shortness of breath. Right lower extremity deep vein thrombosis.  EXAM: CT ANGIOGRAPHY CHEST WITH CONTRAST  TECHNIQUE: Multidetector CT imaging of the chest was performed using the standard protocol during bolus administration of intravenous contrast. Multiplanar CT image reconstructions and MIPs were obtained to evaluate the vascular anatomy.  CONTRAST:  OMNIPAQUE IOHEXOL 350 MG/ML SOLN  COMPARISON:  US VENOUS IMG LOWER UNI *R* dated 08/19/2013  FINDINGS: Large bilateral saddle emboli, especially on the left. These appear to span in the main pulmonary artery division and also the bifurcations of the right and left pulmonary arteries. Clot burden is very high.  Right ventricular to left ventricular ratio is 0.85, currently within normal limits, without reversal of the normal interventricular septal contour.  There is evidence of old granulomatous disease with a calcified lower paratracheal lymph node. Biapical pleural parenchymal scarring with faint nodularity. No pulmonary infarct identified. No significant parenchymal hemorrhage. Calcified granuloma in the superior segment left lower lobe.  Review of the MIP images confirms the above findings.  IMPRESSION: 1. Very large bilateral saddle pulmonary emboli. Clot burden is  very high. Critical Value/emergent results were called by telephone at the time of interpretation on 08/19/2013 at 2:50 PM to Dr. Bruce Donathony Allen, who verbally acknowledged these results.   Electronically Signed   By: Herbie BaltimoreWalt  Liebkemann M.D.   On: 08/19/2013 14:51   [place] Venous Img Lower Unilateral Right  08/19/2013   CLINICAL DATA:  Right leg pain and swelling  EXAM: Right LOWER EXTREMITY VENOUS DOPPLER ULTRASOUND  TECHNIQUE: Gray-scale sonography with graded compression, as well as color  Doppler and duplex ultrasound, were performed to evaluate the deep venous system from the level of the common femoral vein through the popliteal and proximal calf veins. Spectral Doppler was utilized to evaluate flow at rest and with distal augmentation maneuvers.  COMPARISON:  None.  FINDINGS: Thrombus within deep veins: Thrombus is noted within the superficial femoral vein, popliteal vein, gastrocnemius veins and infrapopliteal veins.  Compressibility of deep veins: Decreased compressibility is noted in the above-noted veins. Decreased flow is noted as well.  Venous reflux:  None visualized.  Other findings:  None visualized.  IMPRESSION: Diffuse deep venous thrombosis in the right leg from the mid thigh inferiorly.   Electronically Signed   By: Alcide CleverMark  Lukens M.D.   On: 08/19/2013 11:46    Scheduled Meds: . docusate sodium  100 mg Oral BID   Continuous Infusions: . sodium chloride 75 mL/hr at 08/20/13 0523  . heparin 1,300 Units/hr (08/20/13 0519)   Antibiotics Given (last 72 hours)   None      Principal Problem:   Saddle embolism of pulmonary artery Active Problems:   Pulmonary emboli   Thrombophlebitis leg   HTN (hypertension)   DVT (deep venous thrombosis) multiple vessels right lower extremity    Time spent: 35min    [facility] HospitalJOSEPH,Ameet Sandy  [facility] Pager [phone]. If 7PM-7AM, please contact night-coverage at [email or web address], password [email or web address] 08/20/2013, 7:55 AM  LOS: 1 day

## 2013-08-20 NOTE — Progress Notes (Signed)
ANTICOAGULATION CONSULT NOTE - Follow Up Consult  Pharmacy Consult for heparin Indication: Bilateral pulmonary embolus + right femoral DVT   Labs:  Recent Labs  08/19/13 1516 08/19/13 2010 08/19/13 2318 08/20/13 0149 08/20/13 0810 08/20/13 0950  HGB 13.7 13.8  --   --  14.8  --   HCT 40.6 39.1  --   --  42.4  --   PLT 188 206  --   --  231  --   HEPARINUNFRC  --   --  0.35  --   --  0.38  CREATININE 0.80 0.72  --   --   --   --   TROPONINI  --  <0.30  --  <0.30  --   --     Assessment: 48yo female therapeutic on heparin for new bilateral PE and right DVT but remains lower end of goal despite rate increase. Given large clot burden would like level higher. Spoke with patient today and she reports no s/s of bleeding. Answered questions regarding choosing coumadin vs novel anticoagulant.  Goal of Therapy:  Heparin level 0.3-0.7 units/ml   Plan:  - Increase heparin infusion to 1400 units/hr  - draw 6h HL at 1800  - daily HL and CBC - monitor for s/s of bleeding  - Likely switch to an oral anticoagulant tomorrow    Vinnie LevelBenjamin Ahonesty Woodfin, PharmD.  Clinical Pharmacist Pager [phone]

## 2013-08-20 NOTE — Progress Notes (Signed)
ANTICOAGULATION CONSULT NOTE - Follow Up Consult  Pharmacy Consult for heparin Indication: pulmonary embolus  Labs:  Recent Labs  08/19/13 1516 08/19/13 2010 08/19/13 2318  HGB 13.7 13.8  --   HCT 40.6 39.1  --   PLT 188 206  --   HEPARINUNFRC  --   --  0.35  CREATININE 0.80 0.72  --   TROPONINI  --  <0.30  --     Assessment: 48yo female therapeutic on heparin with initial dosing for PE but at low end of goal, given large clot burden would like level higher.  Goal of Therapy:  Heparin level 0.3-0.7 units/ml   Plan:  Will increase heparin gtt by 1-2 units/kg/hr to 1300 units/hr and check level in 6hr.  Vernard GamblesVeronda Fontaine Hehl, PharmD, BCPS  08/20/2013,12:24 AM

## 2013-08-21 DIAGNOSIS — I2699 Other pulmonary embolism without acute cor pulmonale: Secondary | ICD-10-CM

## 2013-08-21 LAB — PROTEIN C ACTIVITY: PROTEIN C ACTIVITY: 135 % — AB (ref 75–133)

## 2013-08-21 LAB — CBC
HCT: 36.8 % (ref 36.0–46.0)
HEMOGLOBIN: 12.8 g/dL (ref 12.0–15.0)
MCH: 32.9 pg (ref 26.0–34.0)
MCHC: 34.8 g/dL (ref 30.0–36.0)
MCV: 94.6 fL (ref 78.0–100.0)
Platelets: 204 10*3/uL (ref 150–400)
RBC: 3.89 MIL/uL (ref 3.87–5.11)
RDW: 12.6 % (ref 11.5–15.5)
WBC: 6.9 10*3/uL (ref 4.0–10.5)

## 2013-08-21 LAB — LUPUS ANTICOAGULANT PANEL
DRVVT: 38.7 secs (ref <42.9)
LUPUS ANTICOAGULANT: DETECTED — AB
PTT Lupus Anticoagulant: 67.5 secs — ABNORMAL HIGH (ref 28.0–43.0)
PTTLA 4:1 Mix: 71.8 secs — ABNORMAL HIGH (ref 28.0–43.0)
PTTLA Confirmation: 11 secs — ABNORMAL HIGH (ref <8.0)

## 2013-08-21 LAB — HEPARIN LEVEL (UNFRACTIONATED)
HEPARIN UNFRACTIONATED: 0.55 [IU]/mL (ref 0.30–0.70)
HEPARIN UNFRACTIONATED: 0.65 [IU]/mL (ref 0.30–0.70)

## 2013-08-21 LAB — PROTEIN S ACTIVITY: Protein S Activity: 79 % (ref 69–129)

## 2013-08-21 LAB — PROTEIN S, TOTAL: PROTEIN S AG TOTAL: 80 % (ref 60–150)

## 2013-08-21 LAB — BETA-2-GLYCOPROTEIN I ABS, IGG/M/A
BETA 2 GLYCO I IGG: 5 G Units (ref <20)
BETA-2-GLYCOPROTEIN I IGA: 6 A Units (ref <20)
BETA-2-GLYCOPROTEIN I IGM: 7 M Units (ref <20)

## 2013-08-21 LAB — CARDIOLIPIN ANTIBODIES, IGG, IGM, IGA
Anticardiolipin IgA: 9 APL U/mL — ABNORMAL LOW (ref <22)
Anticardiolipin IgG: 8 GPL U/mL — ABNORMAL LOW (ref <23)
Anticardiolipin IgM: 0 MPL U/mL — ABNORMAL LOW (ref <11)

## 2013-08-21 LAB — PROTEIN C, TOTAL: PROTEIN C, TOTAL: 82 % (ref 72–160)

## 2013-08-21 LAB — FACTOR 5 LEIDEN

## 2013-08-21 LAB — PROTHROMBIN GENE MUTATION

## 2013-08-21 MED ORDER — RIVAROXABAN 15 MG PO TABS
15.0000 mg | ORAL_TABLET | Freq: Two times a day (BID) | ORAL | Status: DC
  Administered 2013-08-21 – 2013-08-22 (×3): 15 mg via ORAL
  Filled 2013-08-21 (×4): qty 1

## 2013-08-21 NOTE — Progress Notes (Signed)
  Echocardiogram 2D Echocardiogram has been performed.  Joanne Powell, [facility] 08/21/2013, 9:21 AM

## 2013-08-21 NOTE — Progress Notes (Signed)
Name: Joanne Powell MRN: [id number] DOB: 21-Nov-1965    ADMISSION DATE:  08/19/2013 CONSULTATION DATE:  08/20/2013  REFERRING MD :  Dr Jomarie Longs PRIMARY SERVICE:  TRH  CHIEF COMPLAINT:  PE  BRIEF PATIENT DESCRIPTION: 48 year old female with PMH of HTN admitted 3/16 for bilateral saddle pulmonary emboli. PCCM asked to assist with her care.   SIGNIFICANT EVENTS / STUDIES:  3/16 CTA chest > bilateral saddle pulmonary emboli 3/16 BLE doppler > Thrombus in Right superficial femoral vein, popliteal vein, gastrocnemius, and infrapopliteal veins 3/17 TTE >>>  LINES / TUBES: PIV  CULTURES: None  ANTIBIOTICS: None  SUBJECTIVE:  No events overnight, comfortable on room air.   VITAL SIGNS: Temp:  [98.2 F (36.8 C)-99.1 F (37.3 C)] 98.7 F (37.1 C) (03/18 0528) Pulse Rate:  [80-91] 82 (03/18 0528) Resp:  [16-18] 18 (03/18 0528) BP: (121-136)/(75-81) 132/75 mmHg (03/18 0528) SpO2:  [96 %-97 %] 97 % (03/18 0528)  PHYSICAL EXAMINATION: General:  Overweight female (182 lbs) in no acute distress Neuro:  Alert, oriented HEENT:  [place]/AT no JVD noted Cardiovascular:  RRR Lungs:  Clear posterior chest Abdomen:  Soft, non-tender, non distended Musculoskeletal:  No acute deformity Skin:  Intact, trace edema RLE.    Recent Labs Lab 08/19/13 1516 08/19/13 2010  NA 137 140  K 3.6* 3.7  CL 102 103  CO2 20 21  BUN 9 7  CREATININE 0.80 0.72  GLUCOSE 91 89    Recent Labs Lab 08/19/13 2010 08/20/13 0810 08/21/13 0200  HGB 13.8 14.8 12.8  HCT 39.1 42.4 36.8  WBC 8.2 7.5 6.9  PLT 206 231 204   Ct Angio Chest W/cm &/or Wo Cm  08/19/2013   CLINICAL DATA:  Shortness of breath. Right lower extremity deep vein thrombosis.  EXAM: CT ANGIOGRAPHY CHEST WITH CONTRAST  TECHNIQUE: Multidetector CT imaging of the chest was performed using the standard protocol during bolus administration of intravenous contrast. Multiplanar CT image reconstructions and MIPs were obtained to evaluate the  vascular anatomy.  CONTRAST:  OMNIPAQUE IOHEXOL 350 MG/ML SOLN  COMPARISON:  US VENOUS IMG LOWER UNI *R* dated 08/19/2013  FINDINGS: Large bilateral saddle emboli, especially on the left. These appear to span in the main pulmonary artery division and also the bifurcations of the right and left pulmonary arteries. Clot burden is very high.  Right ventricular to left ventricular ratio is 0.85, currently within normal limits, without reversal of the normal interventricular septal contour.  There is evidence of old granulomatous disease with a calcified lower paratracheal lymph node. Biapical pleural parenchymal scarring with faint nodularity. No pulmonary infarct identified. No significant parenchymal hemorrhage. Calcified granuloma in the superior segment left lower lobe.  Review of the MIP images confirms the above findings.  IMPRESSION: 1. Very large bilateral saddle pulmonary emboli. Clot burden is very high. Critical Value/emergent results were called by telephone at the time of interpretation on 08/19/2013 at 2:50 PM to Dr. Bruce Donath, who verbally acknowledged these results.   Electronically Signed   By: Herbie Baltimore M.D.   On: 08/19/2013 14:51   US Venous Img Lower Unilateral Right  08/19/2013   CLINICAL DATA:  Right leg pain and swelling  EXAM: Right LOWER EXTREMITY VENOUS DOPPLER ULTRASOUND  TECHNIQUE: Gray-scale sonography with graded compression, as well as color Doppler and duplex ultrasound, were performed to evaluate the deep venous system from the level of the common femoral vein through the popliteal and proximal calf veins. Spectral Doppler was utilized to  evaluate flow at rest and with distal augmentation maneuvers.  COMPARISON:  None.  FINDINGS: Thrombus within deep veins: Thrombus is noted within the superficial femoral vein, popliteal vein, gastrocnemius veins and infrapopliteal veins.  Compressibility of deep veins: Decreased compressibility is noted in the above-noted veins. Decreased  flow is noted as well.  Venous reflux:  None visualized.  Other findings:  None visualized.  IMPRESSION: Diffuse deep venous thrombosis in the right leg from the mid thigh inferiorly.   Electronically Signed   By: Alcide CleverMark  Lukens M.D.   On: 08/19/2013 11:46    ASSESSMENT / PLAN:  Bilateral saddle pulmonary emboli   - Start rivaroxaban per pharmacy dosing   - D/c heparin after oral anticoagulation started (per pharmacy)   - No indication for IVC filter   - TTE pending   - 6 months of therapy and then readdress duration   - She will need to use alternative contraceptive technique     - hypercoagulable workup pending   Joneen RoachPaul Hoffman, ACNP [facility] Pager [phone] or [phone]  08/21/2013, 8:13 AM  PCCM ATTENDING: I have interviewed and examined the patient and reviewed the database. I have formulated the assessment and plan as reflected in the note above with amendments made by me.   PCCM will sign off. Please call if we can be of further assistance    Billy Fischeravid Aubreyanna Dorrough, MD;  PCCM service; Mobile [phone]

## 2013-08-21 NOTE — Progress Notes (Signed)
ANTICOAGULATION CONSULT NOTE - Follow Up Consult  Pharmacy Consult for transition from Heparin to Xarelto Indication: Bilateral pulmonary embolus + right femoral DVT   Labs:  Recent Labs  08/19/13 1516 08/19/13 2010  08/20/13 0149 08/20/13 0810  08/20/13 1815 08/21/13 0200 08/21/13 0840  HGB 13.7 13.8  --   --  14.8  --   --  12.8  --   HCT 40.6 39.1  --   --  42.4  --   --  36.8  --   PLT 188 206  --   --  231  --   --  204  --   HEPARINUNFRC  --   --   < >  --   --   < > 0.29* 0.55 0.65  CREATININE 0.80 0.72  --   --   --   --   --   --   --   TROPONINI  --  <0.30  --  <0.30  --   --   --   --   --   < > = values in this interval not displayed.  Assessment: 48yo female therapeutic on heparin for new bilateral PE and right DVT on heparin infusion at 1550 units/hr with therapeutic HL at 0.65. Planning to transition from heparin to Xarelto this evening. Patient reports no s/s of bleeding. Patient was educated on Xarelto today.   Goal of Therapy:  Heparin level 0.3-0.7 units/ml   Plan:  - Continue heparin infusion at 1550 units/hr. Stop infusion at 1630 with first Xarelto dose - Xarelto 15 mg twice daily x 21 days to start at 1630 tonight  - monitor for s/s of bleeding    Vinnie LevelBenjamin Cherlyn Syring, PharmD.  Clinical Pharmacist Pager [phone]

## 2013-08-21 NOTE — Progress Notes (Signed)
Patient ID: Joanne Powell, female   DOB: 19-Sep-1965, 48 y.o.   MRN: [id number] [facility] PROGRESS NOTE  Joanne Powell [id number] DOB: 19-Sep-1965 DOA: 08/19/2013 PCP: Ethel RanaHEPLER,MARK, PA-C  Brief narrative: 48 year old female with PMH of HTN admitted 3/16 for bilateral saddle pulmonary emboli.  Principal Problem:   Saddle embolism of pulmonary artery - Xarelto started - will continue 6 months therapy  - pt tolerating well, no signs of bleeding - appreciate PCCM input  Active Problems:   Thrombophlebitis leg - On Xarelto - continue analgesia as needed   HTN (hypertension) - reasonable inpatient control    DVT (deep venous thrombosis) multiple vessels right lower extremity - Xarelto as noted above   Consultants:  PCCM  Antibiotics:  None  Code Status: Full Family Communication: Pt at bedside Disposition Plan: possible d/c home in AM  HPI/Subjective: No events overnight.   Objective: Filed Vitals:   08/20/13 2108 08/21/13 0431 08/21/13 0528 08/21/13 1410  BP: 136/81 121/77 132/75 138/78  Pulse: 90 80 82 93  Temp: 98.2 F (36.8 C) 98.7 F (37.1 C) 98.7 F (37.1 C) 98.1 F (36.7 C)  TempSrc: Oral Oral Oral Oral  Resp: 18 16 18 20   Height:      Weight:      SpO2: 97% 97% 97% 99%    Intake/Output Summary (Last 24 hours) at 08/21/13 1746 Last data filed at 08/21/13 1300  Gross per 24 hour  Intake   1204 ml  Output    500 ml  Net    704 ml    Exam:   General:  Pt is alert, follows commands appropriately, not in acute distress  Cardiovascular: Regular rate and rhythm, S1/S2, no murmurs, no rubs, no gallops  Respiratory: Clear to auscultation bilaterally, no wheezing, diminished breath sounds at bases   Abdomen: Soft, non tender, non distended, bowel sounds present, no guarding  Extremities: pulses DP and PT palpable bilaterally  Neuro: Grossly nonfocal  Data Reviewed: Basic Metabolic Panel:  Recent Labs Lab 08/19/13 1516 08/19/13 2010   NA 137 140  K 3.6* 3.7  CL 102 103  CO2 20 21  GLUCOSE 91 89  BUN 9 7  CREATININE 0.80 0.72  CALCIUM 8.6 8.6  MG  --  2.1   Liver Function Tests:  Recent Labs Lab 08/19/13 2010  AST 14  ALT 13  ALKPHOS 58  BILITOT 0.3  PROT 7.1  ALBUMIN 3.0*   CBC:  Recent Labs Lab 08/19/13 1516 08/19/13 2010 08/20/13 0810 08/21/13 0200  WBC 8.9 8.2 7.5 6.9  NEUTROABS 6.8 5.9  --   --   HGB 13.7 13.8 14.8 12.8  HCT 40.6 39.1 42.4 36.8  MCV 96.0 93.5 94.9 94.6  PLT 188 206 231 204   Cardiac Enzymes:  Recent Labs Lab 08/19/13 2010 08/20/13 0149  TROPONINI <0.30 <0.30    Scheduled Meds: . docusate sodium  100 mg Oral BID  . Rivaroxaban  15 mg Oral BID WC   Continuous Infusions: . sodium chloride 75 mL/hr at 08/21/13 0600     Debbora PrestoMAGICK-Arneshia Ade, MD  [facility] Pager [phone]  If 7PM-7AM, please contact night-coverage [email or web address] Password [email or web address] 08/21/2013, 5:46 PM   LOS: 2 days

## 2013-08-21 NOTE — Progress Notes (Signed)
ANTICOAGULATION CONSULT NOTE - Follow Up Consult  Pharmacy Consult for heparin Indication: pulmonary embolus and DVT  Labs:  Recent Labs  08/19/13 1516 08/19/13 2010  08/20/13 0149 08/20/13 0810 08/20/13 0950 08/20/13 1815 08/21/13 0200  HGB 13.7 13.8  --   --  14.8  --   --  12.8  HCT 40.6 39.1  --   --  42.4  --   --  36.8  PLT 188 206  --   --  231  --   --  204  HEPARINUNFRC  --   --   < >  --   --  0.38 0.29* 0.55  CREATININE 0.80 0.72  --   --   --   --   --   --   TROPONINI  --  <0.30  --  <0.30  --   --   --   --   < > = values in this interval not displayed.   Assessment/Plan:  48yo female now therapeutic on heparin after rate increase. Will continue gtt at current rate and confirm stable with additional level.   Joanne Powell, PharmD, BCPS  08/21/2013,3:24 AM

## 2013-08-21 NOTE — Discharge Instructions (Addendum)
Information on my medicine - XARELTO (rivaroxaban)  This medication education was reviewed with me or my healthcare representative as part of my discharge preparation.  The pharmacist that spoke with me during my hospital stay was:  Sampson Si, [facility]  WHY WAS XARELTO PRESCRIBED FOR YOU? Xarelto was prescribed to treat blood clots that may have been found in the veins of your legs (deep vein thrombosis) or in your lungs (pulmonary embolism) and to reduce the risk of them occurring again.  What do you need to know about Xarelto? The starting dose is one 15 mg tablet taken TWICE daily with food for the FIRST 21 DAYS then on (enter date)  09/12/13  the dose is changed to one 20 mg tablet taken ONCE A DAY with your evening meal.  DO NOT stop taking Xarelto without talking to the health care provider who prescribed the medication.  Refill your prescription for 20 mg tablets before you run out.  After discharge, you should have regular check-up appointments with your healthcare provider that is prescribing your Xarelto.  In the future your dose may need to be changed if your kidney function changes by a significant amount.  What do you do if you miss a dose? If you are taking Xarelto TWICE DAILY and you miss a dose, take it as soon as you remember. You may take two 15 mg tablets (total 30 mg) at the same time then resume your regularly scheduled 15 mg twice daily the next day.  If you are taking Xarelto ONCE DAILY and you miss a dose, take it as soon as you remember on the same day then continue your regularly scheduled once daily regimen the next day. Do not take two doses of Xarelto at the same time.   Important Safety Information Xarelto is a blood thinner medicine that can cause bleeding. You should call your healthcare provider right away if you experience any of the following:   Bleeding from an injury or your nose that does not stop.   Unusual colored urine (red or dark brown)  or unusual colored stools (red or black).   Unusual bruising for unknown reasons.   A serious fall or if you hit your head (even if there is no bleeding).  Some medicines may interact with Xarelto and might increase your risk of bleeding while on Xarelto. To help avoid this, consult your healthcare provider or pharmacist prior to using any new prescription or non-prescription medications, including herbals, vitamins, non-steroidal anti-inflammatory drugs (NSAIDs) and supplements.  This website has more information on Xarelto: VisitDestination.com.br.  Pulmonary Embolus A pulmonary (lung) embolus (PE) is a blood clot that has traveled from another place in the body to the lung. Most clots come from deep veins in the legs or pelvis. PE is a dangerous and potentially life-threatening condition that can be treated if identified. CAUSES Blood clots form in a vein for different reasons. Usually several things cause blood clots. They include:  The flow of blood slows down.  The inside of the vein is damaged in some way.  The person has a condition that makes the blood clot more easily. These conditions may include:  Older age (especially over 28 years old).  Having a history of blood clots.  Having major or lengthy surgery. Hip surgery is particularly high-risk.  Breaking a hip or leg.  Sitting or lying still for a long time.  Cancer or cancer treatment.  Having a long, thin tube (catheter) placed inside a  vein during a medical procedure.  Being overweight (obese).  Pregnancy and childbirth.  Medicines with estrogen.  Smoking.  Other circulation or heart problems. SYMPTOMS  The symptoms of a PE usually start suddenly and include:  Shortness of breath.  Coughing.  Coughing up blood or blood-tinged mucus (phlegm).  Chest pain. Pain is often worse with deep breaths.  Rapid heartbeat. DIAGNOSIS  If a PE is suspected, your caregiver will take a medical history and carry out a  physical exam. Your caregiver will check for the risk factors listed above. Tests that also may be required include:  Blood tests, including studies of the clotting properties of your blood.  Imaging tests. Ultrasound, CT, MRI, and other tests can all be used to see if you have clots in your legs or lungs. If you have a clot in your legs and have breathing or chest problems, your caregiver may conclude that you have a clot in your lungs. Further lung tests may not be needed.  Electrocardiography can look for heart strain from blood clots in the lungs. PREVENTION   Exercise the legs regularly. Take a brisk 30 minute walk every day.  Maintain a weight that is appropriate for your height.  Avoid sitting or lying in bed for long periods of time without moving your legs.  Women, particularly those over the age of 46, should consider the risks and benefits of taking estrogen medicines, including birth control pills.  Do not smoke, especially if you take estrogen medicines.  Long-distance travel can increase your risk. You should exercise your legs by walking or pumping the muscles every hour.  In hospital prevention:  Your caregiver will assess your need for preventive PE care (prophylaxis) when you are admitted to the hospital. If you are having surgery, your surgeon will assess you the day of or day after surgery.  Prevention may include medical and nonmedical measures. TREATMENT   The most common treatment for a PE is blood thinning (anticoagulant) medicine, which reduces the blood's tendency to clot. Anticoagulants can stop new blood clots from forming and old ones from growing. They cannot dissolve existing clots. Your body does this by itself over time. Anticoagulants can be given by mouth, by intravenous (IV) access, or by injection. Your caregiver will determine the best program for you.  Less commonly, clot-dissolving drugs (thrombolytics) are used to dissolve a PE. They carry a high  risk of bleeding, so they are used mainly in severe cases.  Very rarely, a blood clot in the leg needs to be removed surgically.  If you are unable to take anticoagulants, your caregiver may arrange for you to have a filter placed in a main vein in your abdomen. This filter prevents clots from traveling to your lungs. HOME CARE INSTRUCTIONS   Take all medicines prescribed by your caregiver. Follow the directions carefully.  Warfarin. Most people will continue taking warfarin after hospital discharge. Your caregiver will advise you on the length of treatment (usually 3 6 months, sometimes lifelong).  Too much and too little warfarin are both dangerous. Too much warfarin increases the risk of bleeding. Too little warfarin continues to allow the risk for blood clots. While taking warfarin, you will need to have regular blood tests to measure your blood clotting time. These blood tests usually include both the prothrombin time (PT) and International Normalized Ratio (INR) tests. The PT and INR results allow your caregiver to adjust your dose of warfarin. The dose can change for many reasons. It  is critically important that you take warfarin exactly as prescribed, and that you have your PT and INR levels drawn exactly as directed.  Many foods, especially foods high in vitamin K can interfere with warfarin and affect the PT and INR results. Foods high in vitamin K include spinach, kale, broccoli, cabbage, collard and turnip greens, brussels sprouts, peas, cauliflower, seaweed, and parsley as well as beef and pork liver, green tea, and soybean oil. You should eat a consistent amount of foods high in vitamin K. Avoid major changes in your diet, or notify your caregiver before changing your diet. Arrange a visit with a dietitian to answer your questions.  Many medicines can interfere with warfarin and affect the PT and INR results. You must tell your caregiver about any and all medicines you take, this  includes all vitamins and supplements. Be especially cautious with aspirin and anti-inflammatory medicines. Ask your caregiver before taking these. Do not take or discontinue any prescribed or over-the-counter medicine except on the advice of your caregiver or pharmacist.  Warfarin can have side effects, such as excessive bruising or bleeding. You will need to hold pressure over cuts for longer than usual.  Alcohol can change the body's ability to handle warfarin. It is best to avoid alcoholic drinks or consume only very small amounts while taking warfarin. Notify your caregiver if you change your alcohol intake.  Notify your dentist or other caregivers before procedures.  Avoid contact sports.  Wear a medical alert bracelet or carry a medical alert card.  Ask your caregiver how soon you can go back to normal activities. Not being active can lead to new clots. Ask for a list of what you should and should not do.  Compression stockings. These are tight elastic stockings that apply pressure to the lower legs. This can help keep the blood in the legs from clotting. You may need to wear compressions stockings at home to help prevent clots.  Smoking. If you smoke, quit. Ask your caregiver for help with quitting smoking.  Learn as much as you can about PE. Educating yourself can help prevent PE from reoccurring. SEEK MEDICAL CARE IF:   You notice a rapid heartbeat.  You feel weaker or more tired than usual.  You feel faint.  You notice increased bruising.  Your symptoms are not getting better in the time expected.  You are having side effects of medicine. SEEK IMMEDIATE MEDICAL CARE IF:   You have chest pain.  You have trouble breathing.  You have new or increased swelling or pain in one leg.  You cough up blood.  You notice blood in vomit, in a bowel movement, or in urine.  You have an oral temperature above 102 F (38.9 C), not controlled by medicine. You may have another  PE. A blood clot in the lungs is a medical emergency. Call your local emergency services (911 in [place] to get to the nearest hospital or clinic. Do not drive yourself. MAKE SURE YOU:   Understand these instructions.  Will watch your condition.  Will get help right away if you are not doing well or get worse. Document Released: 05/20/2000 Document Revised: 11/22/2011 Document Reviewed: 11/24/2008 [facility] [employer] [place] [employer] Patient Information 2014 [place], [place].

## 2013-08-22 LAB — CBC
HEMATOCRIT: 39 % (ref 36.0–46.0)
Hemoglobin: 13.4 g/dL (ref 12.0–15.0)
MCH: 32.5 pg (ref 26.0–34.0)
MCHC: 34.4 g/dL (ref 30.0–36.0)
MCV: 94.7 fL (ref 78.0–100.0)
Platelets: 243 10*3/uL (ref 150–400)
RBC: 4.12 MIL/uL (ref 3.87–5.11)
RDW: 12.6 % (ref 11.5–15.5)
WBC: 5.7 10*3/uL (ref 4.0–10.5)

## 2013-08-22 LAB — BASIC METABOLIC PANEL
BUN: 8 mg/dL (ref 6–23)
CHLORIDE: 107 meq/L (ref 96–112)
CO2: 21 mEq/L (ref 19–32)
Calcium: 8.8 mg/dL (ref 8.4–10.5)
Creatinine, Ser: 0.77 mg/dL (ref 0.50–1.10)
Glucose, Bld: 82 mg/dL (ref 70–99)
POTASSIUM: 3.8 meq/L (ref 3.7–5.3)
SODIUM: 141 meq/L (ref 137–147)

## 2013-08-22 MED ORDER — RIVAROXABAN 20 MG PO TABS: 20.0000 mg | ORAL_TABLET | Freq: Every day | ORAL | Status: AC

## 2013-08-22 MED ORDER — RIVAROXABAN 15 MG PO TABS: 15.0000 mg | ORAL_TABLET | Freq: Two times a day (BID) | ORAL | Status: AC

## 2013-08-22 MED ORDER — OXYCODONE HCL 5 MG PO TABS: 5.0000 mg | ORAL_TABLET | ORAL | Status: AC | PRN

## 2013-08-22 NOTE — Discharge Summary (Signed)
Physician Discharge Summary  Joanne RaddleCynthia Powell UEA:[id number] DOB: [birth year] DOA: 08/19/2013  PCP: Joanne RanaHEPLER,MARK, PA-C  Admit date: 08/19/2013 Discharge date: 08/22/2013  Recommendations for Outpatient Follow-up:  1. Pt will need to follow up with PCP in 2-3 weeks post discharge 2. Please obtain BMP to evaluate electrolytes and kidney function 3. Please also check CBC to evaluate Hg and Hct levels 4. Pt discharged on Xarelto and instructed to see PCP within 2 weeks of discharge 5. Pt also made aware that her treatment should last 6 months and she needs to have re assessment to determine if continuation of anticoagulation required   Discharge Diagnoses: Pulmonary embolism  Principal Problem:   Saddle embolism of pulmonary artery Active Problems:   Pulmonary emboli   Thrombophlebitis leg   HTN (hypertension)   DVT (deep venous thrombosis) multiple vessels right lower extremity  Discharge Condition: Stable  Diet recommendation: Heart healthy diet discussed in details   Brief narrative:  48 year old female with PMH of HTN admitted 3/16 for bilateral saddle pulmonary emboli.   Principal Problem:  Saddle embolism of pulmonary artery  - Xarelto started and tolerating well, no signs of bleeding  - will continue 6 months therapy  - appreciate PCCM input  Active Problems:  Thrombophlebitis leg  - On Xarelto  - continue analgesia as needed  HTN (hypertension)  - reasonable inpatient control  DVT (deep venous thrombosis) multiple vessels right lower extremity  - Xarelto as noted above   Consultants:  PCCM  Antibiotics:  None  Code Status: Full  Family Communication: Pt at bedside   Discharge Exam: Filed Vitals:   08/22/13 0544  BP: 117/72  Pulse: 78  Temp: 98.2 F (36.8 C)  Resp: 18   Filed Vitals:   08/21/13 0528 08/21/13 1410 08/21/13 2143 08/22/13 0544  BP: 132/75 138/78 120/82 117/72  Pulse: 82 93 78 78  Temp: 98.7 F (37.1 C) 98.1 F (36.7 C) 99 F (37.2 C) 98.2  F (36.8 C)  TempSrc: Oral Oral Oral Oral  Resp: 18 20 18 18   Height:      Weight:      SpO2: 97% 99% 99% 98%    General: Pt is alert, follows commands appropriately, not in acute distress Cardiovascular: Regular rate and rhythm, S1/S2 +, no murmurs, no rubs, no gallops Respiratory: Clear to auscultation bilaterally, no wheezing, no crackles, no rhonchi Abdominal: Soft, non tender, non distended, bowel sounds +, no guarding Extremities: no edema, no cyanosis, pulses palpable bilaterally DP and PT Neuro: Grossly nonfocal  Discharge Instructions  Discharge Orders   Future Orders Complete By Expires   Diet - low sodium heart healthy  As directed    Increase activity slowly  As directed        Medication List    STOP taking these medications       naproxen sodium 220 MG tablet  Commonly known as:  ANAPROX      TAKE these medications       ibuprofen 200 MG tablet  Commonly known as:  ADVIL,MOTRIN  Take 200-400 mg by mouth every 6 (six) hours as needed for fever or mild pain.     multivitamin tablet  Take 1 tablet by mouth daily.     oxyCODONE 5 MG immediate release tablet  Commonly known as:  Oxy IR/ROXICODONE  Take 1 tablet (5 mg total) by mouth every 4 (four) hours as needed for moderate pain.     RECLIPSEN PO  Take 1 tablet by mouth  daily.     Rivaroxaban 15 MG Tabs tablet  Commonly known as:  XARELTO  Take 1 tablet (15 mg total) by mouth 2 (two) times daily with a meal. Take Xarelto for 20 more days, 15 mg tablet BID. Stop date April 7th, 2015. After completion continue taking 20 mg tablet once daily.     Rivaroxaban 20 MG Tabs tablet  Commonly known as:  XARELTO  Take 1 tablet (20 mg total) by mouth daily with supper. Start taking April 7th, 2015.  Start taking on:  09/10/2013           Follow-up Information   Schedule an appointment as soon as possible for a visit with Powell,MARK, PA-C.   Specialty:  Physician Assistant   Contact information:   [address] [place] [address] [phone]       Follow up with Joanne Presto, MD. (If symptoms worsen, As needed, call my cell phone [phone])    Specialty:  Internal Medicine   Contact information:   [address] [place] [address] [phone]        The results of significant diagnostics from this hospitalization (including imaging, microbiology, ancillary and laboratory) are listed below for reference.     Microbiology: No results found for this or any previous visit (from the past 240 hour(s)).   Labs: Basic Metabolic Panel:  Recent Labs Lab 08/19/13 1516 08/19/13 2010 08/22/13 0525  NA 137 140 141  K 3.6* 3.7 3.8  CL 102 103 107  CO2 20 21 21   GLUCOSE 91 89 82  BUN 9 7 8   CREATININE 0.80 0.72 0.77  CALCIUM 8.6 8.6 8.8  MG  --  2.1  --    Liver Function Tests:  Recent Labs Lab 08/19/13 2010  AST 14  ALT 13  ALKPHOS 58  BILITOT 0.3  PROT 7.1  ALBUMIN 3.0*   No results found for this basename: LIPASE, AMYLASE,  in the last 168 hours No results found for this basename: AMMONIA,  in the last 168 hours CBC:  Recent Labs Lab 08/19/13 1516 08/19/13 2010 08/20/13 0810 08/21/13 0200 08/22/13 0525  WBC 8.9 8.2 7.5 6.9 5.7  NEUTROABS 6.8 5.9  --   --   --   HGB 13.7 13.8 14.8 12.8 13.4  HCT 40.6 39.1 42.4 36.8 39.0  MCV 96.0 93.5 94.9 94.6 94.7  PLT 188 206 231 204 243   Cardiac Enzymes:  Recent Labs Lab 08/19/13 2010 08/20/13 0149  TROPONINI <0.30 <0.30   BNP: BNP (last 3 results) No results found for this basename: PROBNP,  in the last 8760 hours CBG: No results found for this basename: GLUCAP,  in the last 168 hours   SIGNED: Time coordinating discharge: Over 30 minutes  Joanne Presto, MD  [facility] 08/22/2013, 12:35 PM Pager [phone]  If 7PM-7AM, please contact night-coverage [email or web address] Password [email or web address]

## 2013-08-22 NOTE — Care Management Note (Signed)
    Page 1 of 1   08/22/2013     3:36:16 PM   CARE MANAGEMENT NOTE 08/22/2013  Patient:  Joanne Powell,Joanne Powell   Account Number:  [id number]  Date Initiated:  08/22/2013  Documentation initiated by:  Sejal Cofield  Subjective/Objective Assessment:   PT ADM ON 3/16 WITH SADDLE PE, DVT.  PTA, PT INDEPENDENT OF ADLS.     Action/Plan:   PT TO DC ON XARELTO.  PT GIVEN [employer]IST[employer] CARD, WHICH WILL ALLOW HER TO GET MED FOR FREE FOR ONE YEAR.  CARD HAS BEEN ACTIVATED.   Anticipated DC Date:  08/22/2013   Anticipated DC Plan:  HOME/SELF CARE      DC Planning Services  CM consult  Medication Assistance      Choice offered to / List presented to:             Status of service:  Completed, signed off Medicare Important Message given?   (If response is "NO", the following Medicare IM given date fields will be blank) Date Medicare IM given:   Date Additional Medicare IM given:    Discharge Disposition:  HOME/SELF CARE  Per UR Regulation:  Reviewed for med. necessity/level of care/duration of stay  If discussed at Long Length of Stay Meetings, dates discussed:    Comments:

## 2013-08-23 NOTE — Progress Notes (Signed)
PT DISCHARGE HOME WITH HUSBAND. PT CONCERNED ABOUT RT LEG SWELLING. PT WANTED MD CALLED. RN CALLED DR. MYERS MADE HER AWARE OF PTS CONCERNS. DR Izola PriceMYERS EXPLAINED TO RN TO REASSURE PT THAT THE SWELLING WAS NORMAL AND TO NOT REPORT TO WORK UNTIL Monday AND TO ELEVATE, AND APPLY TED STOCKING TO LEGS AS NEEDED TO HELP WITH SWELLING. PT VU. DISCHARGE INSTRUCTIONS REVIEWED WITH PT. PRESCRIPTIONS GIVEN. ENCOURAGE PT TO CALL WITH ANY NEEDS. DR. Izola PriceMYERS GAVE PT CELL NUMBER TO CALL WITH AND NEEDS OR CONCERNS.

## 2013-09-02 LAB — ANTIPHOSPHOLIPID SYNDROME EVAL, BLD
ANTICARDIOLIPIN IGA: 8 U/mL — AB (ref <22)
ANTICARDIOLIPIN IGG: 6 GPL U/mL — AB (ref <23)
Anticardiolipin IgM: 2 MPL U/mL — ABNORMAL LOW (ref <11)
PHOSPHATYDALSERINE, IGM: 1 U/mL (ref <22)
Phosphatydalserine, IgA: 7 U/mL (ref <20)
Phosphatydalserine, IgG: 8 U/mL (ref <16)

## 2014-01-27 ENCOUNTER — Other Ambulatory Visit: Payer: Self-pay | Admitting: Family Medicine

## 2014-01-27 DIAGNOSIS — I2699 Other pulmonary embolism without acute cor pulmonale: Secondary | ICD-10-CM

## 2014-03-17 ENCOUNTER — Other Ambulatory Visit: Payer: Self-pay | Admitting: Obstetrics and Gynecology

## 2014-03-17 DIAGNOSIS — R928 Other abnormal and inconclusive findings on diagnostic imaging of breast: Secondary | ICD-10-CM

## 2014-03-19 ENCOUNTER — Ambulatory Visit
Admission: RE | Admit: 2014-03-19 | Discharge: 2014-03-19 | Disposition: A | Discharge status: Home / Self Care | Payer: 59 | Source: Ambulatory Visit | Attending: Obstetrics and Gynecology | Admitting: Obstetrics and Gynecology

## 2014-03-19 DIAGNOSIS — R928 Other abnormal and inconclusive findings on diagnostic imaging of breast: Secondary | ICD-10-CM

## 2014-03-24 ENCOUNTER — Ambulatory Visit
Admission: RE | Admit: 2014-03-24 | Discharge: 2014-03-24 | Disposition: A | Discharge status: Home / Self Care | Payer: 59 | Source: Ambulatory Visit | Attending: Family Medicine | Admitting: Family Medicine

## 2014-03-24 DIAGNOSIS — I2699 Other pulmonary embolism without acute cor pulmonale: Secondary | ICD-10-CM

## 2014-03-24 MED ORDER — IOHEXOL 350 MG/ML SOLN
100.0000 mL | Freq: Once | INTRAVENOUS | Status: AC | PRN
  Administered 2014-03-24: 100 mL via INTRAVENOUS

## 2014-03-26 ENCOUNTER — Other Ambulatory Visit: Payer: 59

## 2016-03-09 ENCOUNTER — Other Ambulatory Visit: Payer: Self-pay | Admitting: Obstetrics and Gynecology

## 2016-03-09 DIAGNOSIS — Z1231 Encounter for screening mammogram for malignant neoplasm of breast: Secondary | ICD-10-CM

## 2016-03-18 ENCOUNTER — Ambulatory Visit
Admission: RE | Admit: 2016-03-18 | Discharge: 2016-03-18 | Disposition: A | Discharge status: Home / Self Care | Payer: Self-pay | Source: Ambulatory Visit | Attending: Obstetrics and Gynecology | Admitting: Obstetrics and Gynecology

## 2016-03-18 DIAGNOSIS — Z1231 Encounter for screening mammogram for malignant neoplasm of breast: Secondary | ICD-10-CM

## 2016-03-21 ENCOUNTER — Other Ambulatory Visit: Payer: Self-pay | Admitting: Obstetrics and Gynecology

## 2016-03-21 DIAGNOSIS — N644 Mastodynia: Secondary | ICD-10-CM

## 2016-03-29 ENCOUNTER — Ambulatory Visit
Admission: RE | Admit: 2016-03-29 | Discharge: 2016-03-29 | Disposition: A | Discharge status: Home / Self Care | Payer: Managed Care, Other (non HMO) | Source: Ambulatory Visit | Attending: Obstetrics and Gynecology | Admitting: Obstetrics and Gynecology

## 2016-03-29 ENCOUNTER — Other Ambulatory Visit: Payer: Self-pay | Admitting: Obstetrics and Gynecology

## 2016-03-29 DIAGNOSIS — N644 Mastodynia: Secondary | ICD-10-CM

## 2017-05-18 ENCOUNTER — Other Ambulatory Visit: Payer: Self-pay | Admitting: Obstetrics and Gynecology

## 2017-05-18 DIAGNOSIS — Z1231 Encounter for screening mammogram for malignant neoplasm of breast: Secondary | ICD-10-CM

## 2017-06-15 ENCOUNTER — Ambulatory Visit
Admission: RE | Admit: 2017-06-15 | Discharge: 2017-06-15 | Disposition: A | Discharge status: Home / Self Care | Payer: Managed Care, Other (non HMO) | Source: Ambulatory Visit | Attending: Obstetrics and Gynecology | Admitting: Obstetrics and Gynecology

## 2017-06-15 DIAGNOSIS — Z1231 Encounter for screening mammogram for malignant neoplasm of breast: Secondary | ICD-10-CM

## 2018-06-22 ENCOUNTER — Other Ambulatory Visit: Payer: Self-pay | Admitting: Obstetrics and Gynecology

## 2018-06-22 DIAGNOSIS — Z1231 Encounter for screening mammogram for malignant neoplasm of breast: Secondary | ICD-10-CM

## 2018-07-16 ENCOUNTER — Ambulatory Visit
Admission: RE | Admit: 2018-07-16 | Discharge: 2018-07-16 | Disposition: A | Discharge status: Home / Self Care | Payer: BLUE CROSS/BLUE SHIELD | Source: Ambulatory Visit

## 2018-07-16 DIAGNOSIS — Z1231 Encounter for screening mammogram for malignant neoplasm of breast: Secondary | ICD-10-CM

## 2019-07-16 ENCOUNTER — Other Ambulatory Visit: Payer: Self-pay | Admitting: Obstetrics and Gynecology

## 2019-07-16 DIAGNOSIS — Z1231 Encounter for screening mammogram for malignant neoplasm of breast: Secondary | ICD-10-CM

## 2019-07-18 ENCOUNTER — Ambulatory Visit
Admission: RE | Admit: 2019-07-18 | Discharge: 2019-07-18 | Disposition: A | Discharge status: Home / Self Care | Payer: PRIVATE HEALTH INSURANCE | Source: Ambulatory Visit

## 2019-07-18 ENCOUNTER — Other Ambulatory Visit: Payer: Self-pay

## 2019-07-18 DIAGNOSIS — Z1231 Encounter for screening mammogram for malignant neoplasm of breast: Secondary | ICD-10-CM

## 2019-11-12 LAB — COLOGUARD: COLOGUARD: NEGATIVE

## 2019-11-12 LAB — EXTERNAL GENERIC LAB PROCEDURE: COLOGUARD: NEGATIVE

## 2020-07-06 ENCOUNTER — Other Ambulatory Visit: Payer: Self-pay | Admitting: Obstetrics and Gynecology

## 2020-07-06 DIAGNOSIS — Z1231 Encounter for screening mammogram for malignant neoplasm of breast: Secondary | ICD-10-CM

## 2020-07-27 ENCOUNTER — Inpatient Hospital Stay: Admission: RE | Admit: 2020-07-27 | Payer: BC Managed Care – PPO | Source: Ambulatory Visit

## 2020-07-27 DIAGNOSIS — Z1231 Encounter for screening mammogram for malignant neoplasm of breast: Secondary | ICD-10-CM

## 2020-09-15 ENCOUNTER — Inpatient Hospital Stay: Admission: RE | Admit: 2020-09-15 | Payer: BC Managed Care – PPO | Source: Ambulatory Visit

## 2020-09-15 ENCOUNTER — Ambulatory Visit
Admission: RE | Admit: 2020-09-15 | Discharge: 2020-09-15 | Disposition: A | Discharge status: Home / Self Care | Payer: BC Managed Care – PPO | Source: Ambulatory Visit | Attending: Obstetrics and Gynecology | Admitting: Obstetrics and Gynecology

## 2020-09-15 ENCOUNTER — Other Ambulatory Visit: Payer: Self-pay

## 2020-09-15 DIAGNOSIS — Z1231 Encounter for screening mammogram for malignant neoplasm of breast: Secondary | ICD-10-CM

## 2021-08-26 ENCOUNTER — Other Ambulatory Visit: Payer: Self-pay | Admitting: Obstetrics and Gynecology

## 2021-08-26 DIAGNOSIS — Z1231 Encounter for screening mammogram for malignant neoplasm of breast: Secondary | ICD-10-CM

## 2021-09-22 ENCOUNTER — Ambulatory Visit
Admission: RE | Admit: 2021-09-22 | Discharge: 2021-09-22 | Disposition: A | Discharge status: Home / Self Care | Payer: BC Managed Care – PPO | Source: Ambulatory Visit | Attending: Obstetrics and Gynecology | Admitting: Obstetrics and Gynecology

## 2021-09-22 DIAGNOSIS — Z1231 Encounter for screening mammogram for malignant neoplasm of breast: Secondary | ICD-10-CM

## 2022-08-25 ENCOUNTER — Encounter: Payer: Self-pay | Admitting: Obstetrics and Gynecology

## 2022-08-25 DIAGNOSIS — Z1231 Encounter for screening mammogram for malignant neoplasm of breast: Secondary | ICD-10-CM

## 2022-09-21 ENCOUNTER — Other Ambulatory Visit: Payer: Self-pay | Admitting: Obstetrics and Gynecology

## 2022-09-21 DIAGNOSIS — R928 Other abnormal and inconclusive findings on diagnostic imaging of breast: Secondary | ICD-10-CM

## 2022-09-26 ENCOUNTER — Other Ambulatory Visit: Payer: Self-pay | Admitting: Obstetrics and Gynecology

## 2022-09-26 DIAGNOSIS — N644 Mastodynia: Secondary | ICD-10-CM

## 2022-10-14 ENCOUNTER — Ambulatory Visit
Admission: RE | Admit: 2022-10-14 | Discharge: 2022-10-14 | Disposition: A | Discharge status: Home / Self Care | Payer: BC Managed Care – PPO | Source: Ambulatory Visit | Attending: Obstetrics and Gynecology | Admitting: Obstetrics and Gynecology

## 2022-10-14 DIAGNOSIS — N644 Mastodynia: Secondary | ICD-10-CM

## 2022-10-24 IMAGING — MG MM DIGITAL SCREENING BILAT W/ TOMO AND CAD
8 series · 8 of 24 positions shown · non-contrast
Comparison: Previous exam(s).

CLINICAL DATA: Screening.

EXAM:
DIGITAL SCREENING BILATERAL MAMMOGRAM WITH TOMOSYNTHESIS AND CAD
TECHNIQUE: Bilateral screening digital craniocaudal and mediolateral oblique
mammograms were obtained. Bilateral screening digital breast
tomosynthesis was performed. The images were evaluated with
computer-aided detection.

[L CC synth-2D]
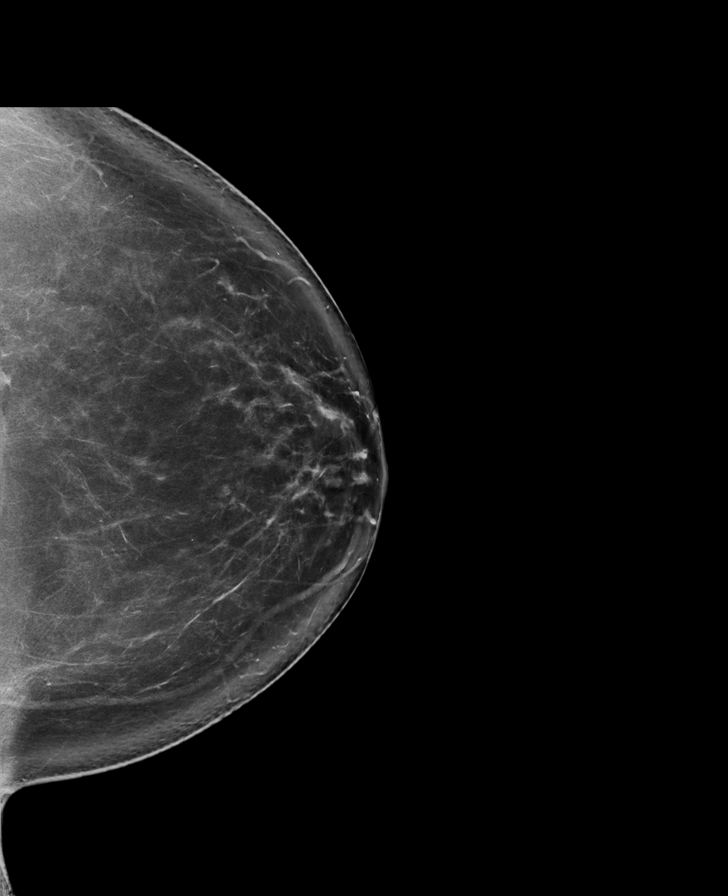

[R MLO synth-2D]
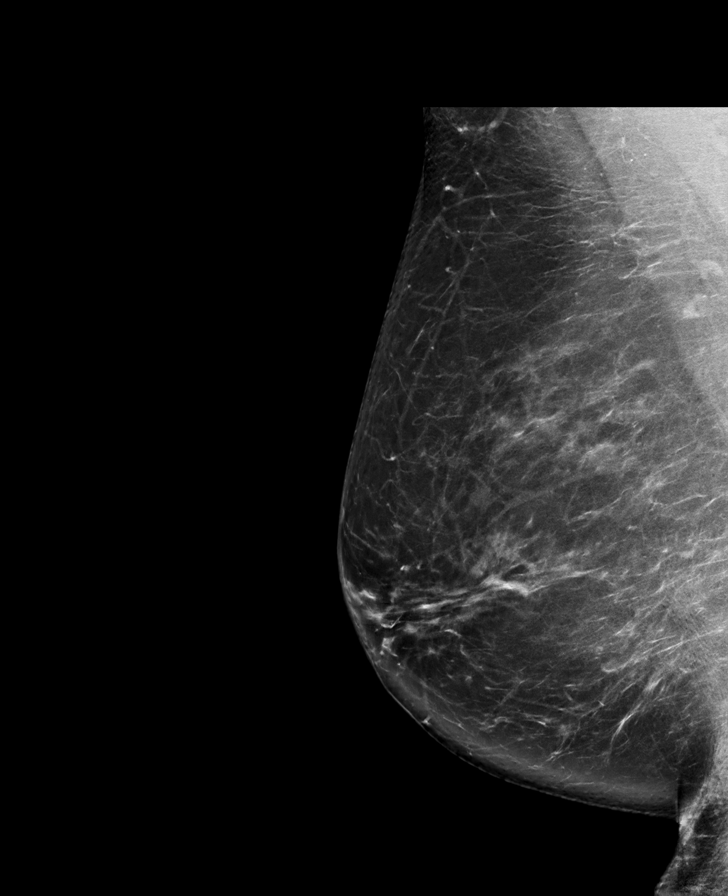

[R CC synth-2D]
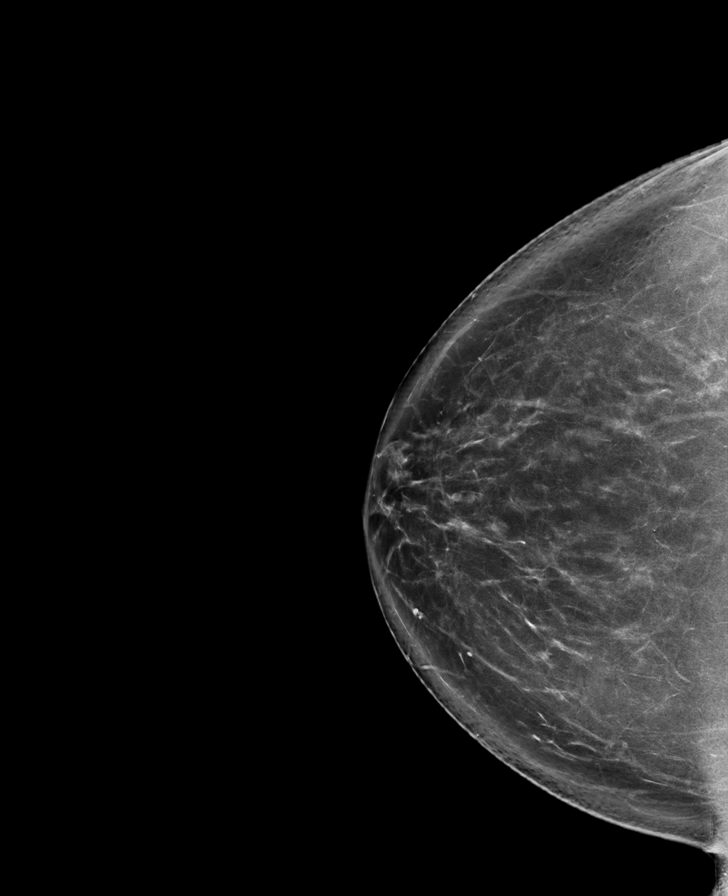

[L MLO synth-2D]
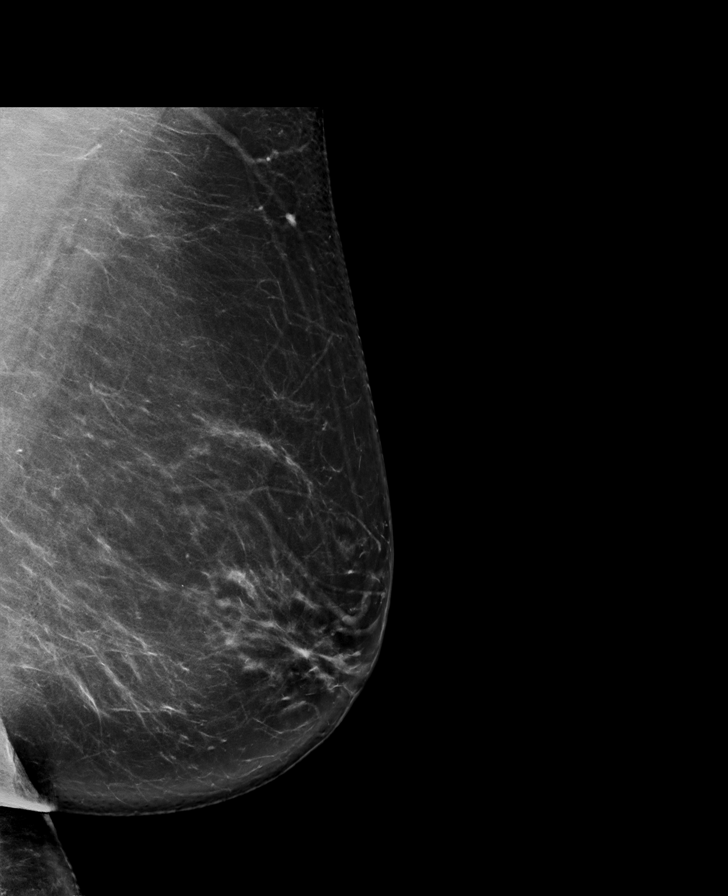

[L CC tomo · tomo slice 53/105.0]
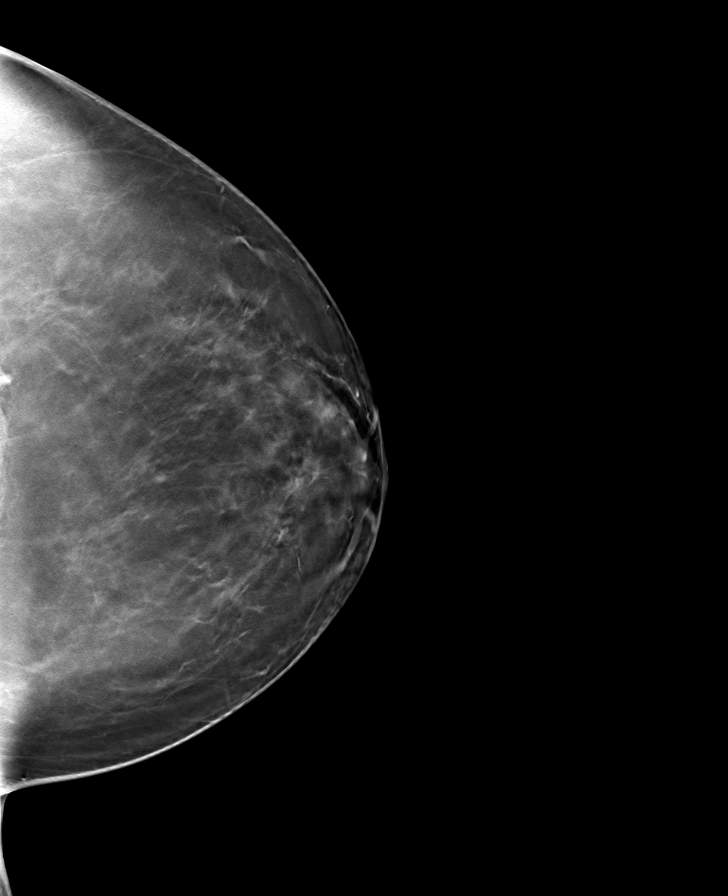

[R CC tomo · tomo slice 51/101.0]
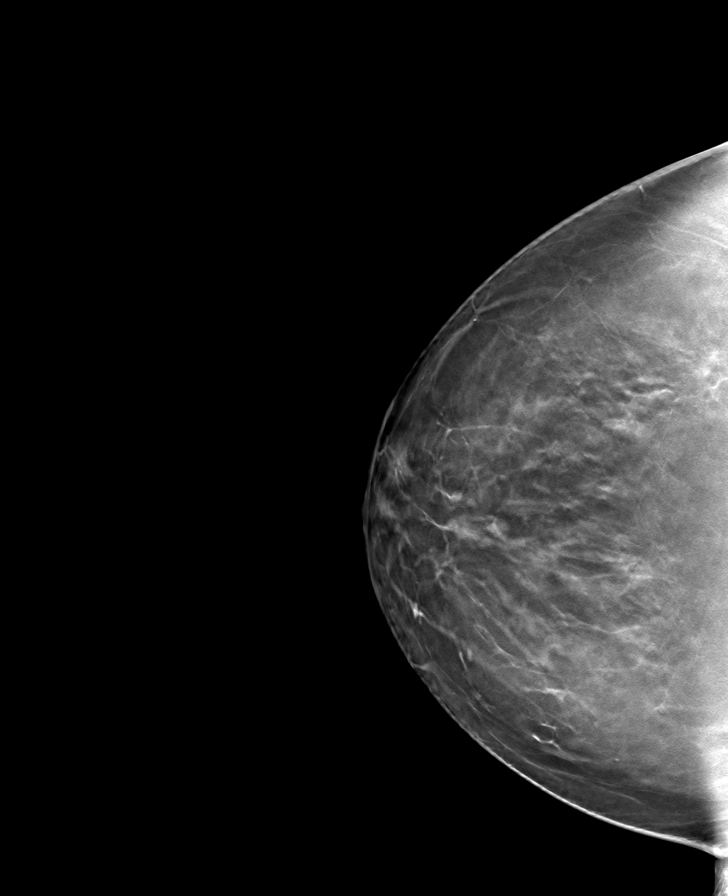

[R MLO tomo · tomo slice 49/96.0]
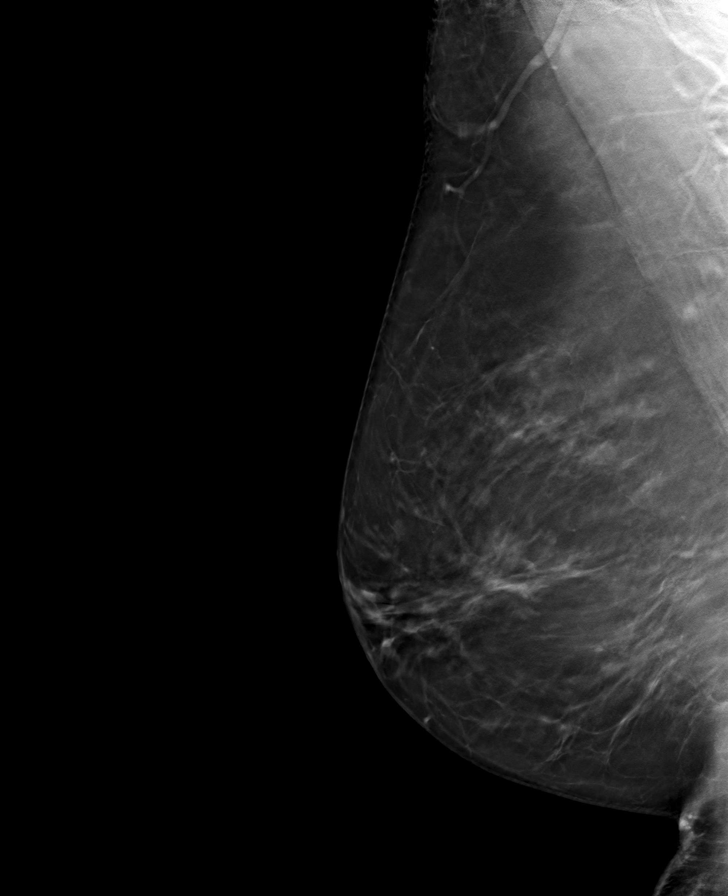

[L MLO tomo · tomo slice 51/100.0]
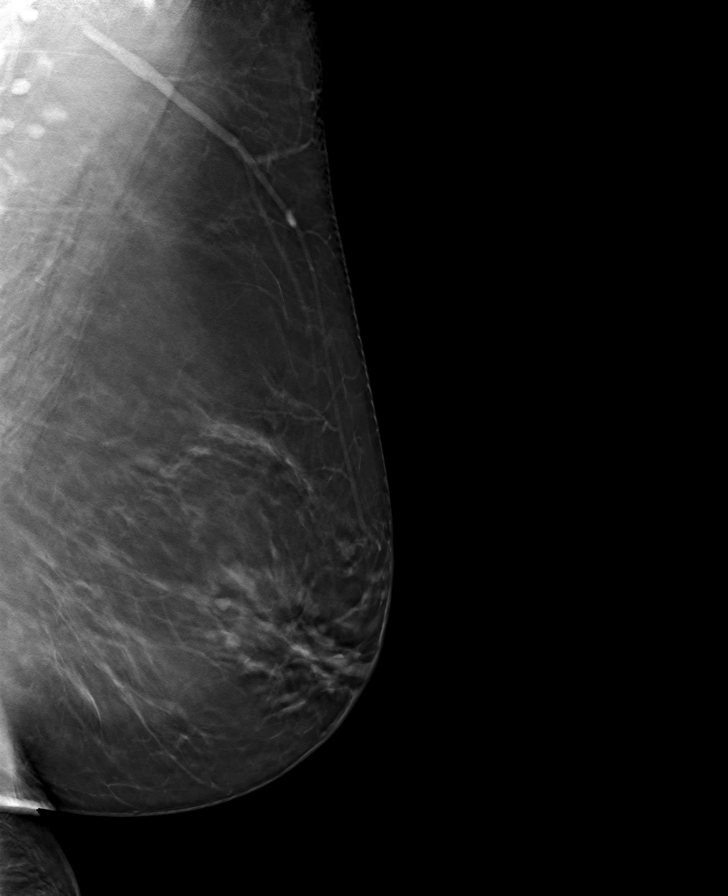

[8 of 24 positions shown; findings below may reference images not displayed]

ACR Breast Density Category b: There are scattered areas of
fibroglandular density.
FINDINGS: There are no findings suspicious for malignancy.
IMPRESSION: No mammographic evidence of malignancy. A result letter of this
screening mammogram will be mailed directly to the patient.

RECOMMENDATION:
Screening mammogram in one year. (Code:51-O-LD2)

BI-RADS CATEGORY  1: Negative.

## 2023-04-21 LAB — COLOGUARD: COLOGUARD: NEGATIVE

## 2023-04-21 LAB — EXTERNAL GENERIC LAB PROCEDURE: COLOGUARD: NEGATIVE

## 2023-05-17 ENCOUNTER — Other Ambulatory Visit (HOSPITAL_COMMUNITY): Payer: Self-pay | Admitting: Urology

## 2023-05-17 DIAGNOSIS — R310 Gross hematuria: Secondary | ICD-10-CM

## 2023-05-24 ENCOUNTER — Encounter (HOSPITAL_COMMUNITY): Payer: Self-pay

## 2023-05-24 ENCOUNTER — Ambulatory Visit (HOSPITAL_COMMUNITY)
Admission: RE | Admit: 2023-05-24 | Discharge: 2023-05-24 | Disposition: A | Discharge status: Home / Self Care | Payer: BC Managed Care – PPO | Source: Ambulatory Visit | Attending: Urology | Admitting: Urology

## 2023-05-24 DIAGNOSIS — R310 Gross hematuria: Secondary | ICD-10-CM

## 2023-09-18 ENCOUNTER — Other Ambulatory Visit: Payer: Self-pay | Admitting: Obstetrics and Gynecology

## 2023-09-18 DIAGNOSIS — Z1231 Encounter for screening mammogram for malignant neoplasm of breast: Secondary | ICD-10-CM

## 2023-10-17 ENCOUNTER — Ambulatory Visit
Admission: RE | Admit: 2023-10-17 | Discharge: 2023-10-17 | Disposition: A | Discharge status: Home / Self Care | Source: Ambulatory Visit | Attending: Obstetrics and Gynecology | Admitting: Obstetrics and Gynecology

## 2023-10-17 DIAGNOSIS — Z1231 Encounter for screening mammogram for malignant neoplasm of breast: Secondary | ICD-10-CM
